# Patient Record
Sex: Male | Born: 1970
Health system: Southern US, Community
[De-identification: ages and names within clinical notes are randomized; demographics above are authoritative.]

## PROBLEM LIST (undated history)

## (undated) DIAGNOSIS — F32A Depression, unspecified: Secondary | ICD-10-CM

## (undated) DIAGNOSIS — F329 Major depressive disorder, single episode, unspecified: Secondary | ICD-10-CM

## (undated) DIAGNOSIS — I10 Essential (primary) hypertension: Secondary | ICD-10-CM

## (undated) DIAGNOSIS — E781 Pure hyperglyceridemia: Secondary | ICD-10-CM

## (undated) DIAGNOSIS — Q602 Renal agenesis, unspecified: Secondary | ICD-10-CM

## (undated) DIAGNOSIS — K219 Gastro-esophageal reflux disease without esophagitis: Secondary | ICD-10-CM

## (undated) DIAGNOSIS — E785 Hyperlipidemia, unspecified: Secondary | ICD-10-CM

## (undated) DIAGNOSIS — Q600 Renal agenesis, unilateral: Secondary | ICD-10-CM

## (undated) DIAGNOSIS — Z973 Presence of spectacles and contact lenses: Secondary | ICD-10-CM

## (undated) DIAGNOSIS — T7840XA Allergy, unspecified, initial encounter: Secondary | ICD-10-CM

## (undated) DIAGNOSIS — F419 Anxiety disorder, unspecified: Secondary | ICD-10-CM

## (undated) DIAGNOSIS — G43909 Migraine, unspecified, not intractable, without status migrainosus: Secondary | ICD-10-CM

## (undated) DIAGNOSIS — B079 Viral wart, unspecified: Secondary | ICD-10-CM

## (undated) DIAGNOSIS — Z9289 Personal history of other medical treatment: Secondary | ICD-10-CM

## (undated) HISTORY — DX: Migraine, unspecified, not intractable, without status migrainosus: G43.909

## (undated) HISTORY — DX: Depression, unspecified: F32.A

## (undated) HISTORY — DX: Hyperlipidemia, unspecified: E78.5

## (undated) HISTORY — DX: Essential (primary) hypertension: I10

## (undated) HISTORY — DX: Gastro-esophageal reflux disease without esophagitis: K21.9

## (undated) HISTORY — DX: Renal agenesis, unspecified: Q60.2

## (undated) HISTORY — DX: Major depressive disorder, single episode, unspecified: F32.9

## (undated) HISTORY — DX: Pure hyperglyceridemia: E78.1

## (undated) HISTORY — DX: Viral wart, unspecified: B07.9

## (undated) HISTORY — DX: Renal agenesis, unilateral: Q60.0

## (undated) HISTORY — DX: Anxiety disorder, unspecified: F41.9

## (undated) HISTORY — PX: WISDOM TOOTH EXTRACTION: SHX21

## (undated) HISTORY — DX: Allergy, unspecified, initial encounter: T78.40XA

## (undated) HISTORY — DX: Presence of spectacles and contact lenses: Z97.3

## (undated) HISTORY — DX: Personal history of other medical treatment: Z92.89

---

## 2002-05-24 DIAGNOSIS — I10 Essential (primary) hypertension: Secondary | ICD-10-CM

## 2002-05-24 HISTORY — DX: Essential (primary) hypertension: I10

## 2005-05-24 HISTORY — PX: KNEE SURGERY: SHX244

## 2010-05-24 HISTORY — PX: COLONOSCOPY: SHX174

## 2011-05-25 HISTORY — PX: MINOR FULGERATION OF ANAL CONDYLOMA: SHX6467

## 2012-05-24 DIAGNOSIS — B079 Viral wart, unspecified: Secondary | ICD-10-CM

## 2012-05-24 HISTORY — DX: Viral wart, unspecified: B07.9

## 2014-05-24 DIAGNOSIS — Z9289 Personal history of other medical treatment: Secondary | ICD-10-CM

## 2014-05-24 HISTORY — DX: Personal history of other medical treatment: Z92.89

## 2016-01-28 DIAGNOSIS — S93401A Sprain of unspecified ligament of right ankle, initial encounter: Secondary | ICD-10-CM | POA: Diagnosis not present

## 2016-02-05 DIAGNOSIS — S93401D Sprain of unspecified ligament of right ankle, subsequent encounter: Secondary | ICD-10-CM | POA: Diagnosis not present

## 2016-05-03 ENCOUNTER — Ambulatory Visit (INDEPENDENT_AMBULATORY_CARE_PROVIDER_SITE_OTHER): Payer: BLUE CROSS/BLUE SHIELD | Admitting: Medical

## 2016-05-03 ENCOUNTER — Telehealth: Payer: Self-pay

## 2016-05-03 ENCOUNTER — Encounter: Payer: Self-pay | Admitting: Medical

## 2016-05-03 VITALS — BP 132/70 | HR 80 | Ht 72.0 in | Wt 177.4 lb

## 2016-05-03 DIAGNOSIS — I10 Essential (primary) hypertension: Secondary | ICD-10-CM

## 2016-05-03 DIAGNOSIS — Z125 Encounter for screening for malignant neoplasm of prostate: Secondary | ICD-10-CM

## 2016-05-03 DIAGNOSIS — Z7189 Other specified counseling: Secondary | ICD-10-CM

## 2016-05-03 DIAGNOSIS — F329 Major depressive disorder, single episode, unspecified: Secondary | ICD-10-CM | POA: Diagnosis not present

## 2016-05-03 DIAGNOSIS — K219 Gastro-esophageal reflux disease without esophagitis: Secondary | ICD-10-CM

## 2016-05-03 DIAGNOSIS — Z0000 Encounter for general adult medical examination without abnormal findings: Secondary | ICD-10-CM | POA: Insufficient documentation

## 2016-05-03 DIAGNOSIS — E785 Hyperlipidemia, unspecified: Secondary | ICD-10-CM | POA: Diagnosis not present

## 2016-05-03 DIAGNOSIS — Z7185 Encounter for immunization safety counseling: Secondary | ICD-10-CM | POA: Insufficient documentation

## 2016-05-03 DIAGNOSIS — F32A Depression, unspecified: Secondary | ICD-10-CM | POA: Insufficient documentation

## 2016-05-03 HISTORY — DX: Depression, unspecified: F32.A

## 2016-05-03 LAB — CBC
HCT: 47.8 % (ref 38.5–50.0)
Hemoglobin: 15.6 g/dL (ref 13.2–17.1)
MCH: 30 pg (ref 27.0–33.0)
MCHC: 32.6 g/dL (ref 32.0–36.0)
MCV: 91.9 fL (ref 80.0–100.0)
MPV: 9.5 fL (ref 7.5–12.5)
Platelets: 351 10*3/uL (ref 140–400)
RBC: 5.2 MIL/uL (ref 4.20–5.80)
RDW: 13.1 % (ref 11.0–15.0)
WBC: 5.5 10*3/uL (ref 4.0–10.5)

## 2016-05-03 LAB — POCT URINALYSIS DIPSTICK
Bilirubin, UA: NEGATIVE
Blood, UA: NEGATIVE
Glucose, UA: NEGATIVE
Ketones, UA: NEGATIVE
Leukocytes, UA: NEGATIVE
Nitrite, UA: NEGATIVE
Protein, UA: NEGATIVE
Spec Grav, UA: 1.025
Urobilinogen, UA: NEGATIVE
pH, UA: 6

## 2016-05-03 LAB — TSH: TSH: 0.98 mIU/L (ref 0.40–4.50)

## 2016-05-03 MED ORDER — SIMVASTATIN 20 MG PO TABS: 20.0000 mg | ORAL_TABLET | Freq: Every day | ORAL | 3 refills | Status: DC

## 2016-05-03 MED ORDER — BUPROPION HCL ER (SR) 150 MG PO TB12: 150.0000 mg | ORAL_TABLET | Freq: Every day | ORAL | 1 refills | Status: DC

## 2016-05-03 MED ORDER — LISINOPRIL 5 MG PO TABS: 5.0000 mg | ORAL_TABLET | Freq: Every day | ORAL | 3 refills | Status: DC

## 2016-05-03 NOTE — Telephone Encounter (Signed)
rcvd this morning for pt from [place]. Placed on chart for physical. Chase Rogers

## 2016-05-03 NOTE — Progress Notes (Addendum)
Subjective:   HPI  Chase Rogers is a 45 y.o. male who presents for a complete physical.  New patient today.  I also see his husband for routine care.  Moved to [place] from [place].   His parents just moved here from [place], brother lives outside of [place].  Medical care team includes:  Recently established with dentist here in [place], [place], [place] establishing today for primary care  Doesn't have eye doctor yet   Prior vaccinations: TD or Tdap:  2015  Concerns: none  Reviewed their medical, surgical, family, social, medication, and allergy history and updated chart as appropriate.  Past Medical History:  Diagnosis Date  . Allergy   . Congenital single kidney    per prior ultrasound and other imaging  . Depression   . GERD (gastroesophageal reflux disease)   . History of EKG 2016   normal per report  . Hyperlipidemia   . Hypertension 2004  . Migraine   . Wart 2014   finger  . Wears contact lenses     Past Surgical History:  Procedure Laterality Date  . COLONOSCOPY  2012   abdominal pain, brother hx/o Crohns.  normal per patient  . KNEE SURGERY  2007   right meniscal tear  . MINOR FULGERATION OF ANAL CONDYLOMA  2013    Social History   Social History  . Marital status: Married    Spouse name: N/A  . Number of children: N/A  . Years of education: N/A   Occupational History  . Not on file.   Social History Main Topics  . Smoking status: Former Research scientist (life sciences)  . Smokeless tobacco: Never Used  . Alcohol use 3.6 oz/week    2 Glasses of wine, 2 Cans of beer, 2 Shots of liquor per week  . Drug use: No  . Sexual activity: Yes   Other Topics Concern  . Not on file   Social History Narrative   Lives with husband.  Exercise - "needs to do more".   Eats relatively healthy.   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 Arthritis Mother   . Hyperlipidemia Mother   . Hypertension Mother    . Cancer Mother     melanoma  . Arthritis Father     TKR  . Crohn's disease Brother   . Cancer Maternal Grandmother     breast, stomach  . Heart disease Maternal Grandfather     MI  . Cancer Paternal Grandmother     colon     Current Outpatient Prescriptions:  .  buPROPion (WELLBUTRIN SR) 150 MG 12 hr tablet, Take 1 tablet (150 mg total) by mouth daily., Disp: 90 tablet, Rfl: 1 .  esomeprazole (NEXIUM) 20 MG capsule, Take 20 mg by mouth daily at 12 noon., Disp: , Rfl:  .  lisinopril (PRINIVIL,ZESTRIL) 5 MG tablet, Take 1 tablet (5 mg total) by mouth daily., Disp: 90 tablet, Rfl: 3 .  Multiple Vitamin (MULTIVITAMIN) tablet, Take 1 tablet by mouth daily., Disp: , Rfl:  .  Omega-3 Fatty Acids (FISH OIL) 1000 MG CPDR, Take by mouth daily., Disp: , Rfl:  .  simvastatin (ZOCOR) 20 MG tablet, Take 1 tablet (20 mg total) by mouth daily., Disp: 90 tablet, Rfl: 3  Allergies  Allergen Reactions  . Erythromycin     Age 20, unsure of allergy      Review of Systems Constitutional: -fever, -chills, -sweats, -unexpected  weight change, -decreased appetite, -fatigue Allergy: -sneezing, -itching, -congestion Dermatology: -changing moles, --rash, -lumps ENT: -runny nose, -ear pain, -sore throat, -hoarseness, -sinus pain, -teeth pain, - ringing in ears, -hearing loss, -nosebleeds Cardiology: -chest pain, -palpitations, -swelling, -difficulty breathing when lying flat, -waking up short of breath Respiratory: -cough, -shortness of breath, -difficulty breathing with exercise or exertion, -wheezing, -coughing up blood Gastroenterology: -abdominal pain, -nausea, -vomiting, -diarrhea, -constipation, -blood in stool, -changes in bowel movement, -difficulty swallowing or eating Hematology: -bleeding, -bruising  Musculoskeletal: -joint aches, -muscle aches, -joint swelling, -back pain, -neck pain, -cramping, -changes in gait Ophthalmology: denies vision changes, eye redness, itching, discharge Urology:  -burning with urination, -difficulty urinating, -blood in urine, -urinary frequency, -urgency, -incontinence Neurology: -headache, -weakness, -tingling, -numbness, -memory loss, -falls, -dizziness Psychology: -depressed mood, -agitation, -sleep problems     Objective:   Physical Exam  BP 132/70   Pulse 80   Ht 6' (1.829 m)   Wt 177 lb 6.4 oz (80.5 kg)   SpO2 98%   BMI 24.06 kg/m   General appearance: alert, no distress, WD/WN, white male Skin: scattered macules, no worrisome lesions, tattoo of red scorpion right inguinal area, tattoo red devil and pitch fork, right upper back HEENT: normocephalic, conjunctiva/corneas normal, sclerae anicteric, PERRLA, EOMi, nares patent, no discharge or erythema, pharynx normal Oral cavity: MMM, tongue normal, teeth normal Neck: supple, no lymphadenopathy, no thyromegaly, no masses, normal ROM, no bruits Chest: non tender, normal shape and expansion Heart: RRR, normal S1, S2, no murmurs Lungs: CTA bilaterally, no wheezes, rhonchi, or rales Abdomen: +bs, soft, non tender, non distended, no masses, no hepatomegaly, no splenomegaly, no bruits Back: non tender, normal ROM, no scoliosis Musculoskeletal: faint port scars right anterior knee otherwise upper extremities non tender, no obvious deformity, normal ROM throughout, lower extremities non tender, no obvious deformity, normal ROM throughout Extremities: no edema, no cyanosis, no clubbing Pulses: 2+ symmetric, upper and lower extremities, normal cap refill Neurological: alert, oriented x 3, CN2-12 intact, strength normal upper extremities and lower extremities, sensation normal throughout, DTRs 2+ throughout, no cerebellar signs, gait normal Psychiatric: normal affect, behavior normal, pleasant  GU: normal male external genitalia, circumcised, nontender, no masses, no hernia, no lymphadenopathy Rectal: anus normal tone, prostate WNL    Assessment and Plan :    Encounter Diagnoses  Name Primary?   . Encounter for health maintenance examination in adult Yes  . Essential hypertension   . Hyperlipidemia, unspecified hyperlipidemia type   . Vaccine counseling   . Gastroesophageal reflux disease without esophagitis   . Depression, unspecified depression type   . Screening for prostate cancer     Physical exam - discussed healthy lifestyle, diet, exercise, preventative care, vaccinations, and addressed their concerns.   See your eye doctor yearly for routine vision care. See your dentist yearly for routine dental care including hygiene visits twice yearly. Flu and Td up to date Routine labs today C/t same medications Follow-up pending labs, yearly for CPX  Morey was seen today for new pat.and physical.  Diagnoses and all orders for this visit:  Encounter for health maintenance examination in adult -     CBC -     Comprehensive metabolic panel -     Lipid panel -     TSH -     PSA  Essential hypertension  Hyperlipidemia, unspecified hyperlipidemia type  Vaccine counseling  Gastroesophageal reflux disease without esophagitis  Depression, unspecified depression type  Screening for prostate cancer -     PSA  Other orders -     buPROPion (WELLBUTRIN SR) 150 MG 12 hr tablet; Take 1 tablet (150 mg total) by mouth daily. -     lisinopril (PRINIVIL,ZESTRIL) 5 MG tablet; Take 1 tablet (5 mg total) by mouth daily. -     simvastatin (ZOCOR) 20 MG tablet; Take 1 tablet (20 mg total) by mouth daily.

## 2016-05-03 NOTE — Telephone Encounter (Signed)
This is yours

## 2016-05-03 NOTE — Addendum Note (Signed)
Addended by: Tyrone Apple on: 05/03/2016 09:28 AM   Modules accepted: Orders

## 2016-05-03 NOTE — Patient Instructions (Signed)
  Thank you for giving me the opportunity to serve you today.    Your diagnosis today includes: Encounter Diagnoses  Name Primary?  . Encounter for health maintenance examination in adult Yes  . Essential hypertension   . Hyperlipidemia, unspecified hyperlipidemia type   . Vaccine counseling   . Gastroesophageal reflux disease without esophagitis   . Depression, unspecified depression type     EXERCISE!  Most days of the week for 45 minutes or more Eat a healthy low fat diet Continue your current medications   [facility] Dr. Camillo Flaming [address], [address] [place], [place] [address]  [email or web address]   Fabio Pierce, M.D. Corena Herter, O.D. [place], [place], [place] [address] Medical telephone: [phone] Optical telephone: [phone]

## 2016-05-04 LAB — LIPID PANEL
Cholesterol: 163 mg/dL (ref <200)
HDL: 51 mg/dL (ref >40)
LDL Cholesterol: 72 mg/dL (ref <100)
Total CHOL/HDL Ratio: 3.2 Ratio (ref <5.0)
Triglycerides: 201 mg/dL — ABNORMAL HIGH (ref <150)
VLDL: 40 mg/dL — ABNORMAL HIGH (ref <30)

## 2016-05-04 LAB — COMPREHENSIVE METABOLIC PANEL
ALT: 40 U/L (ref 9–46)
AST: 33 U/L (ref 10–40)
Albumin: 4.8 g/dL (ref 3.6–5.1)
Alkaline Phosphatase: 72 U/L (ref 40–115)
BUN: 13 mg/dL (ref 7–25)
CO2: 26 mmol/L (ref 20–31)
Calcium: 9.9 mg/dL (ref 8.6–10.3)
Chloride: 102 mmol/L (ref 98–110)
Creat: 0.99 mg/dL (ref 0.60–1.35)
Glucose, Bld: 102 mg/dL — ABNORMAL HIGH (ref 65–99)
Potassium: 4.7 mmol/L (ref 3.5–5.3)
Sodium: 137 mmol/L (ref 135–146)
Total Bilirubin: 0.4 mg/dL (ref 0.2–1.2)
Total Protein: 7.3 g/dL (ref 6.1–8.1)

## 2016-05-04 LAB — PSA: PSA: 0.4 ng/mL (ref <=4.0)

## 2016-05-25 DIAGNOSIS — I10 Essential (primary) hypertension: Secondary | ICD-10-CM | POA: Diagnosis not present

## 2016-05-25 DIAGNOSIS — J22 Unspecified acute lower respiratory infection: Secondary | ICD-10-CM | POA: Diagnosis not present

## 2016-05-25 DIAGNOSIS — J019 Acute sinusitis, unspecified: Secondary | ICD-10-CM | POA: Diagnosis not present

## 2016-05-25 DIAGNOSIS — B9689 Other specified bacterial agents as the cause of diseases classified elsewhere: Secondary | ICD-10-CM | POA: Diagnosis not present

## 2016-06-18 ENCOUNTER — Encounter: Payer: Self-pay | Admitting: Medical

## 2016-07-18 DIAGNOSIS — J020 Streptococcal pharyngitis: Secondary | ICD-10-CM | POA: Diagnosis not present

## 2017-03-01 ENCOUNTER — Encounter: Payer: Self-pay | Admitting: Medical

## 2017-03-13 ENCOUNTER — Other Ambulatory Visit: Payer: Self-pay | Admitting: Medical

## 2017-03-14 NOTE — Telephone Encounter (Signed)
Can pt have a refill on this 

## 2017-03-15 NOTE — Telephone Encounter (Signed)
Refill, get in for physical in December

## 2017-03-22 ENCOUNTER — Telehealth: Payer: Self-pay | Admitting: Medical

## 2017-03-22 NOTE — Telephone Encounter (Signed)
  Fax from [facility]  Bupropion 150mg   #90  Last filled 12/11/16

## 2017-03-23 NOTE — Telephone Encounter (Signed)
I just filled this 03/15/17

## 2017-04-02 ENCOUNTER — Other Ambulatory Visit: Payer: Self-pay | Admitting: Medical

## 2017-04-04 NOTE — Telephone Encounter (Signed)
Why do we keep getting this refill request.  It was done 03/15/17.

## 2017-04-25 ENCOUNTER — Other Ambulatory Visit: Payer: Self-pay | Admitting: Medical

## 2017-05-11 ENCOUNTER — Encounter: Payer: Self-pay | Admitting: Medical

## 2017-05-11 ENCOUNTER — Other Ambulatory Visit: Payer: Self-pay | Admitting: Medical

## 2017-05-11 ENCOUNTER — Ambulatory Visit: Payer: BLUE CROSS/BLUE SHIELD | Admitting: Medical

## 2017-05-11 VITALS — BP 122/70 | HR 98 | Ht 71.0 in | Wt 175.4 lb

## 2017-05-11 DIAGNOSIS — F325 Major depressive disorder, single episode, in full remission: Secondary | ICD-10-CM

## 2017-05-11 DIAGNOSIS — Z113 Encounter for screening for infections with a predominantly sexual mode of transmission: Secondary | ICD-10-CM | POA: Diagnosis not present

## 2017-05-11 DIAGNOSIS — K219 Gastro-esophageal reflux disease without esophagitis: Secondary | ICD-10-CM | POA: Diagnosis not present

## 2017-05-11 DIAGNOSIS — I10 Essential (primary) hypertension: Secondary | ICD-10-CM

## 2017-05-11 DIAGNOSIS — Z0000 Encounter for general adult medical examination without abnormal findings: Secondary | ICD-10-CM

## 2017-05-11 DIAGNOSIS — Z209 Contact with and (suspected) exposure to unspecified communicable disease: Secondary | ICD-10-CM

## 2017-05-11 DIAGNOSIS — E785 Hyperlipidemia, unspecified: Secondary | ICD-10-CM | POA: Diagnosis not present

## 2017-05-11 DIAGNOSIS — L84 Corns and callosities: Secondary | ICD-10-CM | POA: Insufficient documentation

## 2017-05-11 DIAGNOSIS — Z7185 Encounter for immunization safety counseling: Secondary | ICD-10-CM

## 2017-05-11 DIAGNOSIS — Z7189 Other specified counseling: Secondary | ICD-10-CM

## 2017-05-11 LAB — POCT URINALYSIS DIP (PROADVANTAGE DEVICE)
Bilirubin, UA: NEGATIVE
Blood, UA: NEGATIVE
Glucose, UA: NEGATIVE mg/dL
Ketones, POC UA: NEGATIVE mg/dL
Leukocytes, UA: NEGATIVE
Nitrite, UA: NEGATIVE
Protein Ur, POC: NEGATIVE mg/dL
Specific Gravity, Urine: 1.02
Urobilinogen, Ur: NEGATIVE
pH, UA: 7 (ref 5.0–8.0)

## 2017-05-11 NOTE — Progress Notes (Signed)
Subjective:   HPI  Chase Rogers is a 46 y.o. male who presents for a complete physical.  I also see his husband for routine care.  Moved to [place] from [place].   His parents just moved here from [place], brother lives outside of [place].   Medical care team includes:  dentist  Bradie Sangiovanni, Camelia Eng, PA-C establishing today for primary care  Eye doctor  Concerns: History of migraines but none of any concern since age 8.  Gets occasional migraine.  Uses Relpax.  He has some corns of the right foot he wants to discuss.  Not using anything for this   he is compliant with his blood pressure medicine.  Uses Nexium over-the-counter as needed  He regularly travels once monthly to [place], [place].   but recently there have been several hepatitis A  breakouts.  Wants testing possibly vaccine as well.   Reviewed their medical, surgical, family, social, medication, and allergy history and updated chart as appropriate.  Past Medical History:  Diagnosis Date  . Allergy   . Congenital single kidney    per prior ultrasound and other imaging  . Depression   . GERD (gastroesophageal reflux disease)   . History of EKG 2016   normal per report  . Hyperlipidemia   . Hypertension 2004  . Migraine   . Wart 2014   finger  . Wears contact lenses     Past Surgical History:  Procedure Laterality Date  . COLONOSCOPY  2012   abdominal pain, brother hx/o Crohns.  normal per patient  . KNEE SURGERY  2007   right meniscal tear  . MINOR FULGERATION OF ANAL CONDYLOMA  2013    Social History   Socioeconomic History  . Marital status: Married    Spouse name: Not on file  . Number of children: Not on file  . Years of education: Not on file  . Highest education level: Not on file  Social Needs  . Financial resource strain: Not on file  . Food insecurity - worry: Not on file  . Food insecurity - inability: Not on file  . Transportation needs - medical: Not on file  . Transportation needs  - non-medical: Not on file  Occupational History  . Not on file  Tobacco Use  . Smoking status: Former Research scientist (life sciences)  . Smokeless tobacco: Never Used  Substance and Sexual Activity  . Alcohol use: Yes    Alcohol/week: 3.6 oz    Types: 2 Glasses of wine, 2 Cans of beer, 2 Shots of liquor per week  . Drug use: No  . Sexual activity: Yes  Other Topics Concern  . Not on file  Social History Narrative   Lives with husband.  Exercise -[employer] regularly.   Eats relatively healthy.   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 Arthritis Mother   . Hyperlipidemia Mother   . Hypertension Mother   . Cancer Mother        melanoma  . Arthritis Father        TKR  . Crohn's disease Brother   . Cancer Maternal Grandmother        breast, stomach  . Heart disease Maternal Grandfather        MI  . Cancer Paternal Grandmother        colon  . Heart disease Paternal Uncle        multiple MIs  Current Outpatient Medications:  .  buPROPion (WELLBUTRIN SR) 150 MG 12 hr tablet, TAKE 1 TABLET (150 MG TOTAL) BY MOUTH DAILY., Disp: 90 tablet, Rfl: 0 .  esomeprazole (NEXIUM) 20 MG capsule, Take 20 mg by mouth daily at 12 noon., Disp: , Rfl:  .  lisinopril (PRINIVIL,ZESTRIL) 5 MG tablet, TAKE 1 TABLET (5 MG TOTAL) BY MOUTH DAILY., Disp: 90 tablet, Rfl: 0 .  Multiple Vitamin (MULTIVITAMIN) tablet, Take 1 tablet by mouth daily., Disp: , Rfl:  .  Omega-3 Fatty Acids (FISH OIL) 1000 MG CPDR, Take by mouth daily., Disp: , Rfl:  .  simvastatin (ZOCOR) 20 MG tablet, TAKE 1 TABLET (20 MG TOTAL) BY MOUTH DAILY., Disp: 90 tablet, Rfl: 0  Allergies  Allergen Reactions  . Erythromycin     Age 39, unsure of allergy      Review of Systems Constitutional: -fever, -chills, -sweats, -unexpected weight change, -decreased appetite, -fatigue Allergy: -sneezing, -itching, -congestion Dermatology: -changing moles, --rash, -lumps ENT: -runny nose,  -ear pain, -sore throat, -hoarseness, -sinus pain, -teeth pain, - ringing in ears, -hearing loss, -nosebleeds Cardiology: -chest pain, -palpitations, -swelling, -difficulty breathing when lying flat, -waking up short of breath Respiratory: -cough, -shortness of breath, -difficulty breathing with exercise or exertion, -wheezing, -coughing up blood Gastroenterology: -abdominal pain, -nausea, -vomiting, -diarrhea, -constipation, -blood in stool, -changes in bowel movement, -difficulty swallowing or eating Hematology: -bleeding, -bruising  Musculoskeletal: -joint aches, -muscle aches, -joint swelling, -back pain, -neck pain, -cramping, -changes in gait Ophthalmology: denies vision changes, eye redness, itching, discharge Urology: -burning with urination, -difficulty urinating, -blood in urine, -urinary frequency, -urgency, -incontinence Neurology: -headache, -weakness, -tingling, -numbness, -memory loss, -falls, -dizziness Psychology: -depressed mood, -agitation, -sleep problems     Objective:   Physical Exam  BP 122/70   Pulse 98   Ht 5\' 11"  (1.803 m)   Wt 175 lb 6.4 oz (79.6 kg)   SpO2 98%   BMI 24.46 kg/m   General appearance: alert, no distress, WD/WN, white male Skin: scattered macules, no worrisome lesions, tattoo red devil and pitch fork, left upper back, right foot volar surface of great toe and mid foot somewhat laterally with corns that are tender HEENT: normocephalic, conjunctiva/corneas normal, sclerae anicteric, PERRLA, EOMi, nares patent, no discharge or erythema, pharynx normal Oral cavity: MMM, tongue normal, teeth normal Neck: supple, no lymphadenopathy, no thyromegaly, no masses, normal ROM, no bruits Chest: non tender, normal shape and expansion Heart: RRR, normal S1, S2, no murmurs Lungs: CTA bilaterally, no wheezes, rhonchi, or rales Abdomen: +bs, soft, non tender, non distended, no masses, no hepatomegaly, no splenomegaly, no bruits Back: non tender, normal ROM, no  scoliosis Musculoskeletal: faint port scars right anterior knee otherwise upper extremities non tender, no obvious deformity, normal ROM throughout, lower extremities non tender, no obvious deformity, normal ROM throughout Extremities: no edema, no cyanosis, no clubbing Pulses: 2+ symmetric, upper and lower extremities, normal cap refill Neurological: alert, oriented x 3, CN2-12 intact, strength normal upper extremities and lower extremities, sensation normal throughout, DTRs 2+ throughout, no cerebellar signs, gait normal Psychiatric: normal affect, behavior normal, pleasant  GU: normal male external genitalia, circumcised, nontender, no masses, no hernia, no lymphadenopathy Rectal: deferred     Assessment and Plan :    Encounter Diagnoses  Name Primary?  . Routine general medical examination at a health care facility Yes  . Essential hypertension   . Hyperlipidemia, unspecified hyperlipidemia type   . Gastroesophageal reflux disease without esophagitis   . Vaccine counseling   .  Screen for STD (sexually transmitted disease)   . Exposure to communicable disease   . Corn of foot   . Major depression in remission [facility])     Physical exam - discussed healthy lifestyle, diet, exercise, preventative care, vaccinations, and addressed their concerns.   See your eye doctor yearly for routine vision care. See your dentist yearly for routine dental care including hygiene visits twice yearly. Routine labs today C/t same medications Discussed hepatitis screening, and will likely recommend Hep A/B vaccination C/t monthly self testicular exam Corns of right foot - discussed home care with compound W.  If not much improved in the next month, we can refer to dermatology Follow-up pending labs, yearly for CPX  Gurvir was seen today for annual exam.  Diagnoses and all orders for this visit:  Routine general medical examination at a health care facility -     POCT Urinalysis DIP ([employer]  Device) -     Lipid panel -     Comprehensive metabolic panel -     CBC -     Hemoglobin A1c -     HIV antibody -     RPR -     C. trachomatis/N. gonorrhoeae RNA -     Hepatitis panel, acute -     Hepatitis B surface antibody  Essential hypertension  Hyperlipidemia, unspecified hyperlipidemia type  Gastroesophageal reflux disease without esophagitis  Vaccine counseling  Screen for STD (sexually transmitted disease) -     HIV antibody -     RPR -     C. trachomatis/N. gonorrhoeae RNA -     Hepatitis panel, acute -     Hepatitis B surface antibody  Exposure to communicable disease -     HIV antibody -     RPR -     C. trachomatis/N. gonorrhoeae RNA -     Hepatitis panel, acute -     Hepatitis B surface antibody  Corn of foot  Major depression in remission ([place])

## 2017-05-12 LAB — C. TRACHOMATIS/N. GONORRHOEAE RNA
C. trachomatis RNA, TMA: NOT DETECTED
N. gonorrhoeae RNA, TMA: NOT DETECTED

## 2017-05-13 LAB — COMPREHENSIVE METABOLIC PANEL
AG Ratio: 1.7 (calc) (ref 1.0–2.5)
ALT: 37 U/L (ref 9–46)
AST: 28 U/L (ref 10–40)
Albumin: 4.5 g/dL (ref 3.6–5.1)
Alkaline phosphatase (APISO): 82 U/L (ref 40–115)
BUN: 13 mg/dL (ref 7–25)
CO2: 25 mmol/L (ref 20–32)
Calcium: 10 mg/dL (ref 8.6–10.3)
Chloride: 103 mmol/L (ref 98–110)
Creat: 0.95 mg/dL (ref 0.60–1.35)
Globulin: 2.6 g/dL (calc) (ref 1.9–3.7)
Glucose, Bld: 102 mg/dL — ABNORMAL HIGH (ref 65–99)
Potassium: 4.6 mmol/L (ref 3.5–5.3)
Sodium: 138 mmol/L (ref 135–146)
Total Bilirubin: 0.7 mg/dL (ref 0.2–1.2)
Total Protein: 7.1 g/dL (ref 6.1–8.1)

## 2017-05-13 LAB — CBC
HCT: 46.4 % (ref 38.5–50.0)
Hemoglobin: 15.9 g/dL (ref 13.2–17.1)
MCH: 29.3 pg (ref 27.0–33.0)
MCHC: 34.3 g/dL (ref 32.0–36.0)
MCV: 85.6 fL (ref 80.0–100.0)
MPV: 9.7 fL (ref 7.5–12.5)
Platelets: 415 10*3/uL — ABNORMAL HIGH (ref 140–400)
RBC: 5.42 10*6/uL (ref 4.20–5.80)
RDW: 12.7 % (ref 11.0–15.0)
WBC: 5.1 10*3/uL (ref 3.8–10.8)

## 2017-05-13 LAB — HEPATITIS PANEL, ACUTE
Hep A IgM: NONREACTIVE
Hep B C IgM: NONREACTIVE
Hepatitis B Surface Ag: NONREACTIVE
Hepatitis C Ab: NONREACTIVE
SIGNAL TO CUT-OFF: 0.04 (ref <1.00)

## 2017-05-13 LAB — HEMOGLOBIN A1C
Hgb A1c MFr Bld: 5.4 % of total Hgb (ref <5.7)
Mean Plasma Glucose: 108 (calc)
eAG (mmol/L): 6 (calc)

## 2017-05-13 LAB — LIPID PANEL
Cholesterol: 180 mg/dL (ref <200)
HDL: 50 mg/dL (ref >40)
LDL Cholesterol (Calc): 102 mg/dL (calc) — ABNORMAL HIGH
Non-HDL Cholesterol (Calc): 130 mg/dL (calc) — ABNORMAL HIGH (ref <130)
Total CHOL/HDL Ratio: 3.6 (calc) (ref <5.0)
Triglycerides: 167 mg/dL — ABNORMAL HIGH (ref <150)

## 2017-05-13 LAB — HIV ANTIBODY (ROUTINE TESTING W REFLEX): HIV 1&2 Ab, 4th Generation: NONREACTIVE

## 2017-05-13 LAB — FLUORESCENT TREPONEMAL AB(FTA)-IGG-BLD: Fluorescent Treponemal ABS: REACTIVE — AB

## 2017-05-13 LAB — RPR TITER: RPR Titer: 1:16 {titer} — ABNORMAL HIGH

## 2017-05-13 LAB — RPR: RPR Ser Ql: REACTIVE — AB

## 2017-05-14 LAB — HEPATITIS B SURFACE ANTIBODY,QUALITATIVE: Hep B S Ab: NONREACTIVE

## 2017-05-18 ENCOUNTER — Telehealth: Payer: Self-pay | Admitting: Medical

## 2017-05-18 NOTE — Telephone Encounter (Signed)
Pt returned you call. Number in system was verified. Please call pt at [phone].

## 2017-05-25 ENCOUNTER — Other Ambulatory Visit (INDEPENDENT_AMBULATORY_CARE_PROVIDER_SITE_OTHER): Payer: BLUE CROSS/BLUE SHIELD

## 2017-05-25 DIAGNOSIS — Z23 Encounter for immunization: Secondary | ICD-10-CM | POA: Diagnosis not present

## 2017-05-25 DIAGNOSIS — Z209 Contact with and (suspected) exposure to unspecified communicable disease: Secondary | ICD-10-CM

## 2017-05-25 MED ORDER — PENICILLIN G BENZATHINE 2400000 UNIT/4ML IM SUSP
2.4000 10*6.[IU] | Freq: Once | INTRAMUSCULAR | Status: AC
  Administered 2017-05-25 – 2017-05-26 (×2): 2.4 10*6.[IU] via INTRAMUSCULAR

## 2017-05-27 ENCOUNTER — Encounter: Payer: Self-pay | Admitting: Medical

## 2017-05-30 ENCOUNTER — Telehealth: Payer: Self-pay | Admitting: Medical

## 2017-05-30 NOTE — Telephone Encounter (Signed)
Called pt hev said  was fine , does  he need an appt?

## 2017-05-30 NOTE — Telephone Encounter (Signed)
Call regarding f/u on RPR.

## 2017-06-02 ENCOUNTER — Telehealth: Payer: Self-pay | Admitting: Family Medicine

## 2017-06-02 ENCOUNTER — Telehealth: Payer: Self-pay

## 2017-06-02 NOTE — Telephone Encounter (Signed)
Please call Nicholas Lose at the [facility] regarding this patient's syphilis and his treatment.

## 2017-06-02 NOTE — Telephone Encounter (Signed)
[facility] health department called regarding Syphilis results, spoke with him, no other questions. Just wanted to verify address, and number.

## 2017-06-03 ENCOUNTER — Other Ambulatory Visit: Payer: BLUE CROSS/BLUE SHIELD

## 2017-06-03 ENCOUNTER — Other Ambulatory Visit: Payer: Self-pay

## 2017-06-03 DIAGNOSIS — A539 Syphilis, unspecified: Secondary | ICD-10-CM

## 2017-06-03 MED ORDER — PENICILLIN G BENZATHINE 2400000 UNIT/4ML IM SUSP
2.4000 10*6.[IU] | Freq: Once | INTRAMUSCULAR | Status: AC
  Administered 2017-06-03: 2.4 10*6.[IU] via INTRAMUSCULAR

## 2017-06-03 NOTE — Telephone Encounter (Signed)
pls call patient.   First, verify he did come in 05/25/17 for his first Bi-Cillin injection.   I can't see the injection in the chart (check with Beverlee Nims).  The plan at this point is to have 2 more injections 1 week apart.    Have him come in today if possible for 2.4 million units of Bi-Cillin LA , intramuscular injection, and repeat in a week.      I will need to have him repeat labs in 6, 12, and 24 months.  Lets do recheck OV in 4mo

## 2017-06-03 NOTE — Telephone Encounter (Signed)
Called pt he is coming in today at 4:30pm for injection

## 2017-06-03 NOTE — Telephone Encounter (Signed)
Chase Rogers called & confirmed appropriative Syphilis treatment is injection once a week for 3 weeks then titer from 90 days to 6 months later.

## 2017-06-27 ENCOUNTER — Other Ambulatory Visit (INDEPENDENT_AMBULATORY_CARE_PROVIDER_SITE_OTHER): Payer: BLUE CROSS/BLUE SHIELD

## 2017-06-27 DIAGNOSIS — Z23 Encounter for immunization: Secondary | ICD-10-CM

## 2017-08-03 ENCOUNTER — Other Ambulatory Visit: Payer: Self-pay | Admitting: Medical

## 2017-08-03 DIAGNOSIS — H01004 Unspecified blepharitis left upper eyelid: Secondary | ICD-10-CM | POA: Diagnosis not present

## 2017-08-05 ENCOUNTER — Ambulatory Visit: Payer: Self-pay | Admitting: Medical

## 2017-08-23 ENCOUNTER — Encounter: Payer: Self-pay | Admitting: Medical

## 2017-10-29 ENCOUNTER — Other Ambulatory Visit: Payer: Self-pay | Admitting: Medical

## 2017-12-24 ENCOUNTER — Other Ambulatory Visit: Payer: Self-pay | Admitting: Medical

## 2018-01-05 ENCOUNTER — Other Ambulatory Visit (INDEPENDENT_AMBULATORY_CARE_PROVIDER_SITE_OTHER): Payer: BLUE CROSS/BLUE SHIELD

## 2018-01-05 DIAGNOSIS — Z23 Encounter for immunization: Secondary | ICD-10-CM

## 2018-01-25 ENCOUNTER — Other Ambulatory Visit: Payer: Self-pay | Admitting: Medical

## 2018-01-25 DIAGNOSIS — S39012A Strain of muscle, fascia and tendon of lower back, initial encounter: Secondary | ICD-10-CM | POA: Diagnosis not present

## 2018-01-25 DIAGNOSIS — M62830 Muscle spasm of back: Secondary | ICD-10-CM | POA: Diagnosis not present

## 2018-01-25 DIAGNOSIS — M545 Low back pain: Secondary | ICD-10-CM | POA: Diagnosis not present

## 2018-02-03 ENCOUNTER — Other Ambulatory Visit: Payer: Self-pay

## 2018-02-06 MED ORDER — LISINOPRIL 5 MG PO TABS: 5.0000 mg | ORAL_TABLET | Freq: Every day | ORAL | 0 refills | Status: DC

## 2018-02-06 MED ORDER — BUPROPION HCL ER (SR) 150 MG PO TB12: 150.0000 mg | ORAL_TABLET | Freq: Every day | ORAL | 0 refills | Status: DC

## 2018-02-06 MED ORDER — SIMVASTATIN 20 MG PO TABS: 20.0000 mg | ORAL_TABLET | Freq: Every day | ORAL | 0 refills | Status: DC

## 2018-03-18 DIAGNOSIS — R52 Pain, unspecified: Secondary | ICD-10-CM | POA: Diagnosis not present

## 2018-03-18 DIAGNOSIS — J029 Acute pharyngitis, unspecified: Secondary | ICD-10-CM | POA: Diagnosis not present

## 2018-03-18 DIAGNOSIS — J069 Acute upper respiratory infection, unspecified: Secondary | ICD-10-CM | POA: Diagnosis not present

## 2018-04-29 DIAGNOSIS — J069 Acute upper respiratory infection, unspecified: Secondary | ICD-10-CM | POA: Diagnosis not present

## 2018-04-29 DIAGNOSIS — J029 Acute pharyngitis, unspecified: Secondary | ICD-10-CM | POA: Diagnosis not present

## 2018-04-29 DIAGNOSIS — H04209 Unspecified epiphora, unspecified lacrimal gland: Secondary | ICD-10-CM | POA: Diagnosis not present

## 2018-04-29 DIAGNOSIS — R067 Sneezing: Secondary | ICD-10-CM | POA: Diagnosis not present

## 2018-04-30 ENCOUNTER — Other Ambulatory Visit: Payer: Self-pay | Admitting: Medical

## 2018-05-12 ENCOUNTER — Ambulatory Visit: Payer: BLUE CROSS/BLUE SHIELD | Admitting: Medical

## 2018-05-12 ENCOUNTER — Encounter: Payer: Self-pay | Admitting: Medical

## 2018-05-12 VITALS — BP 122/70 | HR 87 | Temp 98.1°F | Resp 16 | Ht 71.0 in | Wt 179.2 lb

## 2018-05-12 DIAGNOSIS — Z23 Encounter for immunization: Secondary | ICD-10-CM

## 2018-05-12 DIAGNOSIS — Z808 Family history of malignant neoplasm of other organs or systems: Secondary | ICD-10-CM

## 2018-05-12 DIAGNOSIS — Z8619 Personal history of other infectious and parasitic diseases: Secondary | ICD-10-CM | POA: Diagnosis not present

## 2018-05-12 DIAGNOSIS — F329 Major depressive disorder, single episode, unspecified: Secondary | ICD-10-CM

## 2018-05-12 DIAGNOSIS — Z113 Encounter for screening for infections with a predominantly sexual mode of transmission: Secondary | ICD-10-CM

## 2018-05-12 DIAGNOSIS — E785 Hyperlipidemia, unspecified: Secondary | ICD-10-CM | POA: Diagnosis not present

## 2018-05-12 DIAGNOSIS — Z0000 Encounter for general adult medical examination without abnormal findings: Secondary | ICD-10-CM

## 2018-05-12 DIAGNOSIS — I10 Essential (primary) hypertension: Secondary | ICD-10-CM | POA: Diagnosis not present

## 2018-05-12 DIAGNOSIS — Z905 Acquired absence of kidney: Secondary | ICD-10-CM | POA: Insufficient documentation

## 2018-05-12 DIAGNOSIS — K219 Gastro-esophageal reflux disease without esophagitis: Secondary | ICD-10-CM | POA: Diagnosis not present

## 2018-05-12 DIAGNOSIS — F32A Depression, unspecified: Secondary | ICD-10-CM

## 2018-05-12 DIAGNOSIS — IMO0002 Reserved for concepts with insufficient information to code with codable children: Secondary | ICD-10-CM

## 2018-05-12 DIAGNOSIS — Q600 Renal agenesis, unilateral: Secondary | ICD-10-CM | POA: Diagnosis not present

## 2018-05-12 LAB — POCT URINALYSIS DIP (PROADVANTAGE DEVICE)
Bilirubin, UA: NEGATIVE
Blood, UA: NEGATIVE
Glucose, UA: NEGATIVE mg/dL
Ketones, POC UA: NEGATIVE mg/dL
Leukocytes, UA: NEGATIVE
Nitrite, UA: NEGATIVE
Protein Ur, POC: NEGATIVE mg/dL
Specific Gravity, Urine: 1.02
Urobilinogen, Ur: NEGATIVE
pH, UA: 6 (ref 5.0–8.0)

## 2018-05-12 MED ORDER — LISINOPRIL 5 MG PO TABS: 5.0000 mg | ORAL_TABLET | Freq: Every day | ORAL | 3 refills | Status: DC

## 2018-05-12 MED ORDER — BUPROPION HCL ER (SR) 150 MG PO TB12: 150.0000 mg | ORAL_TABLET | Freq: Two times a day (BID) | ORAL | 0 refills | Status: DC

## 2018-05-12 NOTE — Patient Instructions (Signed)
Recommendations  I recommend you get regular exercise such as 150 minutes/week of cardiac exercise or more, I recommend you quit smoking  Call 1 800 quit now hotline for good advice and tips on ways to start cutting back  We updated your Tdap, tetanus diphtheria pertussis vaccine today.  This is good for 10 years  We will call back with lab results.  Last year you are positive for syphilis and we gave you treatment for this.  We are rechecking the titers   Yearly screenings See your eye doctor yearly for routine vision care. See your dentist yearly for routine dental care including hygiene visits twice yearly. See me here yearly for a routine physical and preventative care visit     Preventative Care for Adults - Male      [place]:  A routine yearly physical is a good way to check in with your primary care provider about your health and preventive screening. It is also an opportunity to share updates about your health and any concerns you have, and receive a thorough all-over exam.   Most health insurance companies pay for at least some preventative services.  Check with your health plan for specific coverages.  WHAT PREVENTATIVE SERVICES DO MEN NEED?  Adult men should have their weight and blood pressure checked regularly.   Men age 47 and older should have their cholesterol levels checked regularly.  Beginning at age 47 and continuing to age 60, men should be screened for colorectal cancer.  Certain people may need continued testing until age 65.  Updating vaccinations is part of preventative care.  Vaccinations help protect against diseases such as the flu.  Osteoporosis is a disease in which the bones lose minerals and strength as we age. Men ages 47 and over should discuss this with their caregivers  Lab tests are generally done as part of preventative care to screen for anemia and blood disorders, to screen for problems with the kidneys and liver, to  screen for bladder problems, to check blood sugar, and to check your cholesterol level.  Preventative services generally include counseling about diet, exercise, avoiding tobacco, drugs, excessive alcohol consumption, and sexually transmitted infections.    GENERAL RECOMMENDATIONS FOR GOOD HEALTH:  Healthy diet:  Eat a variety of foods, including fruit, vegetables, animal or vegetable protein, such as meat, fish, chicken, and eggs, or beans, lentils, tofu, and grains, such as rice.  Drink plenty of water daily.  Decrease saturated fat in the diet, avoid lots of red meat, processed foods, sweets, fast foods, and fried foods.  Exercise:  Aerobic exercise helps maintain good heart health. At least 30-40 minutes of moderate-intensity exercise is recommended. For example, a brisk walk that increases your heart rate and breathing. This should be done on most days of the week.   Find a type of exercise or a variety of exercises that you enjoy so that it becomes a part of your daily life.  Examples are running, walking, swimming, water aerobics, and biking.  For motivation and support, explore group exercise such as aerobic class, spin class, Zumba, Yoga,or  martial arts, etc.    Set exercise goals for yourself, such as a certain weight goal, walk or run in a race such as a 5k walk/run.  Speak to your primary care provider about exercise goals.  Disease prevention:  If you smoke or chew tobacco, find out from your caregiver how to quit. It can literally save your life, no matter  how long you have been a tobacco user. If you do not use tobacco, never begin.   Maintain a healthy diet and normal weight. Increased weight leads to problems with blood pressure and diabetes.   The Body Mass Index or BMI is a way of measuring how much of your body is fat. Having a BMI above 27 increases the risk of heart disease, diabetes, hypertension, stroke and other problems related to obesity. Your caregiver can  help determine your BMI and based on it develop an exercise and dietary program to help you achieve or maintain this important measurement at a healthful level.  High blood pressure causes heart and blood vessel problems.  Persistent high blood pressure should be treated with medicine if weight loss and exercise do not work.   Fat and cholesterol leaves deposits in your arteries that can block them. This causes heart disease and vessel disease elsewhere in your body.  If your cholesterol is found to be high, or if you have heart disease or certain other medical conditions, then you may need to have your cholesterol monitored frequently and be treated with medication.   Ask if you should have a cardiac stress test if your history suggests this. A stress test is a test done on a treadmill that looks for heart disease. This test can find disease prior to there being a problem.  Osteoporosis is a disease in which the bones lose minerals and strength as we age. This can result in serious bone fractures. Risk of osteoporosis can be identified using a bone density scan. Men ages 47 and over should discuss this with their caregivers. Ask your caregiver whether you should be taking a calcium supplement and Vitamin D, to reduce the rate of osteoporosis.   Avoid drinking alcohol in excess (more than two drinks per day).  Avoid use of street drugs. Do not share needles with anyone. Ask for professional help if you need assistance or instructions on stopping the use of alcohol, cigarettes, and/or drugs.  Brush your teeth twice a day with fluoride toothpaste, and floss once a day. Good oral hygiene prevents tooth decay and gum disease. The problems can be painful, unattractive, and can cause other health problems. Visit your dentist for a routine oral and dental check up and preventive care every 6-12 months.   Look at your skin regularly.  Use a mirror to look at your back. Notify your caregivers of changes in  moles, especially if there are changes in shapes, colors, a size larger than a pencil eraser, an irregular border, or development of new moles.  Safety:  Use seatbelts 100% of the time, whether driving or as a passenger.  Use safety devices such as hearing protection if you work in environments with loud noise or significant background noise.  Use safety glasses when doing any work that could send debris in to the eyes.  Use a helmet if you ride a bike or motorcycle.  Use appropriate safety gear for contact sports.  Talk to your caregiver about gun safety.  Use sunscreen with a SPF (or skin protection factor) of 15 or greater.  Lighter skinned people are at a greater risk of skin cancer. Don't forget to also wear sunglasses in order to protect your eyes from too much damaging sunlight. Damaging sunlight can accelerate cataract formation.   Practice safe sex. Use condoms. Condoms are used for birth control and to help reduce the spread of sexually transmitted infections (or STIs).  Some of  the STIs are gonorrhea (the clap), chlamydia, syphilis, trichomonas, herpes, HPV (human papilloma virus) and HIV (human immunodeficiency virus) which causes AIDS. The herpes, HIV and HPV are viral illnesses that have no cure. These can result in disability, cancer and death.   Keep carbon monoxide and smoke detectors in your home functioning at all times. Change the batteries every 6 months or use a model that plugs into the wall.   Vaccinations:  Stay up to date with your tetanus shots and other required immunizations. You should have a booster for tetanus every 10 years. Be sure to get your flu shot every year, since 5%-20% of the [place] population comes down with the flu. The flu vaccine changes each year, so being vaccinated once is not enough. Get your shot in the fall, before the flu season peaks.   Other vaccines to consider:  Human Papilloma Virus or HPV causes cancer of the cervix, and other infections that  can be transmitted from person to person. There is a vaccine for HPV, and males should get immunized between the ages of 5 and 36. It requires a series of 3 shots.   Pneumococcal vaccine to protect against certain types of pneumonia.  This is normally recommended for adults age 64 or older.  However, adults younger than 47 years old with certain underlying conditions such as diabetes, heart or lung disease should also receive the vaccine.  Shingles vaccine to protect against Varicella Zoster if you are older than age 34, or younger than 47 years old with certain underlying illness.  If you have not had the Shingrix vaccine, please call your insurer to inquire about coverage for the Shingrix vaccine given in 2 doses.   Some insurers cover this vaccine after age 24, some cover this after age 2.  If your insurer covers this, then call to schedule appointment to have this vaccine here  Hepatitis A vaccine to protect against a form of infection of the liver by a virus acquired from food.  Hepatitis B vaccine to protect against a form of infection of the liver by a virus acquired from blood or body fluids, particularly if you work in health care.  If you plan to travel internationally, check with your local health department for specific vaccination recommendations.   What should I know about cancer screening? Many types of cancers can be detected early and may often be prevented. Lung Cancer  You should be screened every year for lung cancer if: ? You are a current smoker who has smoked for at least 30 years. ? You are a former smoker who has quit within the past 15 years.  Talk to your health care provider about your screening options, when you should start screening, and how often you should be screened.  Colorectal Cancer  Routine colorectal cancer screening usually begins at 47 years of age and should be repeated every 5-10 years until you are 47 years old. You may need to be screened more  often if early forms of precancerous polyps or small growths are found. Your health care provider may recommend screening at an earlier age if you have risk factors for colon cancer.  Your health care provider may recommend using home test kits to check for hidden blood in the stool.  A small camera at the end of a tube can be used to examine your colon (sigmoidoscopy or colonoscopy). This checks for the earliest forms of colorectal cancer.  Prostate and Testicular Cancer  Depending on  your age and overall health, your health care provider may do certain tests to screen for prostate and testicular cancer.  Talk to your health care provider about any symptoms or concerns you have about testicular or prostate cancer.  Skin Cancer  Check your skin from head to toe regularly.  Tell your health care provider about any new moles or changes in moles, especially if: ? There is a change in a mole's size, shape, or color. ? You have a mole that is larger than a pencil eraser.  Always use sunscreen. Apply sunscreen liberally and repeat throughout the day.  Protect yourself by wearing long sleeves, pants, a wide-brimmed hat, and sunglasses when outside.

## 2018-05-12 NOTE — Progress Notes (Signed)
Subjective:   HPI  Chase Rogers is a 47 y.o. male who presents for Chief Complaint  Patient presents with  . CPE    fasting cpe     Medical care team includes: Leshaun Biebel, Camelia Eng, PA-C here for primary care Dentist Eye doctor  Concerns: He request bumping up the Wellbutrin to twice daily.  He has been on this in the past during the winter months.  He attributes some of this increased and depressed mood to he used to foster to girls, and 1 of those birthdays were before Christmas and 1 just after Christmas.  He gets sad around this time a year.  He did fine on Wellbutrin 150 twice daily in the past.  He is not exercising regularly  He got a flu shot at pharmacy recently  No other complaint  Reviewed their medical, surgical, family, social, medication, and allergy history and updated chart as appropriate.  Past Medical History:  Diagnosis Date  . Allergy   . Congenital single kidney    per prior ultrasound and other imaging  . Depression   . GERD (gastroesophageal reflux disease)   . History of EKG 2016   normal per report  . Hyperlipidemia   . Hypertension 2004  . Migraine   . Wart 2014   finger  . Wears contact lenses     Past Surgical History:  Procedure Laterality Date  . COLONOSCOPY  2012   abdominal pain, brother hx/o Crohns.  normal per patient  . KNEE SURGERY  2007   right meniscal tear  . MINOR FULGERATION OF ANAL CONDYLOMA  2013    Social History   Socioeconomic History  . Marital status: Married    Spouse name: Not on file  . Number of children: Not on file  . Years of education: Not on file  . Highest education level: Not on file  Occupational History  . Not on file  Social Needs  . Financial resource strain: Not on file  . Food insecurity:    Worry: Not on file    Inability: Not on file  . Transportation needs:    Medical: Not on file    Non-medical: Not on file  Tobacco Use  . Smoking status: Heavy Tobacco Smoker    Packs/day:  0.50    Types: Cigarettes  . Smokeless tobacco: Never Used  Substance and Sexual Activity  . Alcohol use: Yes    Alcohol/week: 6.0 standard drinks    Types: 2 Glasses of wine, 2 Cans of beer, 2 Shots of liquor per week  . Drug use: No  . Sexual activity: Yes  Lifestyle  . Physical activity:    Days per week: Not on file    Minutes per session: Not on file  . Stress: Not on file  Relationships  . Social connections:    Talks on phone: Not on file    Gets together: Not on file    Attends religious service: Not on file    Active member of club or organization: Not on file    Attends meetings of clubs or organizations: Not on file    Relationship status: Not on file  . Intimate partner violence:    Fear of current or ex partner: Not on file    Emotionally abused: Not on file    Physically abused: Not on file    Forced sexual activity: Not on file  Other Topics Concern  . Not on file  Social History Narrative  Lives with husband.  Exercise -[employer] regularly.   Eats relatively healthy.   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 Arthritis Mother   . Hyperlipidemia Mother   . Hypertension Mother   . Cancer Mother        melanoma  . Arthritis Father        TKR  . Crohn's disease Brother   . Cancer Maternal Grandmother        breast, stomach  . Heart disease Maternal Grandfather        MI  . Cancer Paternal Grandmother        colon  . Heart disease Paternal Uncle        multiple MIs     Current Outpatient Medications:  .  buPROPion (WELLBUTRIN SR) 150 MG 12 hr tablet, Take 1 tablet (150 mg total) by mouth 2 (two) times daily., Disp: 180 tablet, Rfl: 0 .  esomeprazole (NEXIUM) 20 MG capsule, Take 20 mg by mouth daily at 12 noon., Disp: , Rfl:  .  lisinopril (PRINIVIL,ZESTRIL) 5 MG tablet, Take 1 tablet (5 mg total) by mouth daily., Disp: 90 tablet, Rfl: 3 .  Multiple Vitamin (MULTIVITAMIN) tablet,  Take 1 tablet by mouth daily., Disp: , Rfl:  .  Omega-3 Fatty Acids (FISH OIL) 1000 MG CPDR, Take by mouth daily., Disp: , Rfl:  .  simvastatin (ZOCOR) 20 MG tablet, TAKE 1 TABLET BY MOUTH EVERY DAY, Disp: 90 tablet, Rfl: 0  Allergies  Allergen Reactions  . Erythromycin     Age 11, unsure of allergy       Review of Systems Constitutional: -fever, -chills, -sweats, -unexpected weight change, -decreased appetite, -fatigue Allergy: -sneezing, -itching, -congestion Dermatology: -changing moles, --rash, -lumps ENT: -runny nose, -ear pain, -sore throat, -hoarseness, -sinus pain, -teeth pain, - ringing in ears, -hearing loss, -nosebleeds Cardiology: -chest pain, -palpitations, -swelling, -difficulty breathing when lying flat, -waking up short of breath Respiratory: -cough, -shortness of breath, -difficulty breathing with exercise or exertion, -wheezing, -coughing up blood Gastroenterology: -abdominal pain, -nausea, -vomiting, -diarrhea, -constipation, -blood in stool, -changes in bowel movement, -difficulty swallowing or eating Hematology: -bleeding, -bruising  Musculoskeletal: -joint aches, -muscle aches, -joint swelling, -back pain, -neck pain, -cramping, -changes in gait Ophthalmology: denies vision changes, eye redness, itching, discharge Urology: -burning with urination, -difficulty urinating, -blood in urine, -urinary frequency, -urgency, -incontinence Neurology: -headache, -weakness, -tingling, -numbness, -memory loss, -falls, -dizziness Psychology: +depressed mood, -agitation, -sleep problems Male GU: no testicular mass, pain, no lymph nodes swollen, no swelling, no rash.     Objective:  BP 122/70   Pulse 87   Temp 98.1 F (36.7 C) (Oral)   Resp 16   Ht 5\' 11"  (1.803 m)   Wt 179 lb 3.2 oz (81.3 kg)   SpO2 98%   BMI 24.99 kg/m   General appearance: alert, no distress, WD/WN, Caucasian male Skin: left scrotum with several small 2-3 mm diameter red brown macules unchanged for  years, several scattered macules, no worrisome lesions, left upper back with tattoo of a little red devil, right inguinal region with tattoo of a red scorpion HEENT: normocephalic, conjunctiva/corneas normal, sclerae anicteric, PERRLA, EOMi, nares patent, no discharge or erythema, pharynx normal Oral cavity: MMM, tongue normal, teeth in good repair Neck: supple, no lymphadenopathy, no thyromegaly, no masses, normal ROM, no bruits Chest: non tender, normal shape and expansion Heart: RRR, normal S1, S2, no murmurs Lungs: CTA bilaterally, no  wheezes, rhonchi, or rales Abdomen: +bs, soft, non tender, non distended, no masses, no hepatomegaly, no splenomegaly, no bruits Back: non tender, normal ROM, no scoliosis Musculoskeletal: upper extremities non tender, no obvious deformity, normal ROM throughout, lower extremities non tender, no obvious deformity, normal ROM throughout Extremities: no edema, no cyanosis, no clubbing Pulses: 2+ symmetric, upper and lower extremities, normal cap refill Neurological: alert, oriented x 3, CN2-12 intact, strength normal upper extremities and lower extremities, sensation normal throughout, DTRs 2+ throughout, no cerebellar signs, gait normal Psychiatric: normal affect, behavior normal, pleasant  GU: normal male external genitalia,circumcised, nontender, no masses, no hernia, no lymphadenopathy Rectal: deferred   Assessment and Plan :   Encounter Diagnoses  Name Primary?  . Routine general medical examination at a health care facility Yes  . Encounter for health maintenance examination in adult   . Essential hypertension   . Gastroesophageal reflux disease without esophagitis   . Depression, unspecified depression type   . Hyperlipidemia, unspecified hyperlipidemia type   . Screen for STD (sexually transmitted disease)   . History of syphilis   . Family history of melanoma   . Solitary kidney   . Need for Tdap vaccination     Physical exam - discussed  and counseled on healthy lifestyle, diet, exercise, preventative care, vaccinations, sick and well care, proper use of emergency dept and after hours care, and addressed their concerns.    Health screening: See your eye doctor yearly for routine vision care. See your dentist yearly for routine dental care including hygiene visits twice yearly.  Discussed STD testing, discussed prevention, condom use, means of transmission  Cancer screening Advised monthly self skin surveillance given family history.   Discussed PSA and colonoscopy age 15, testicular exams  Vaccinations: Advised yearly influenza vaccine  Counseled on the Tdap (tetanus, diptheria, and acellular pertussis) vaccine.  Vaccine information sheet given. Tdap vaccine given after consent obtained.   Acute issues discussed: depression - increase Wellbutrin to BID during winter months.  He has done well on this prior.  Advised f/u 24mo   Separate significant chronic issues discussed: Hypertension, solitary kidney, continue lisinopril  GERD- he uses over-the-counter Nexium as needed, avoid GERD triggers  Advised to consider yearly follow-up with dermatology for surveillance and screening  History of syphilis- f/u pending labs.   He was treated with a series of penicillin within the past year.  Dayvon was seen today for cpe.  Diagnoses and all orders for this visit:  Routine general medical examination at a health care facility -     POCT Urinalysis DIP (Proadvantage Device) -     Comprehensive metabolic panel -     CBC with Differential/Platelet -     Lipid panel -     HIV Antibody (routine testing w rflx) -     GC/Chlamydia Probe Amp -     RPR  Encounter for health maintenance examination in adult  Essential hypertension  Gastroesophageal reflux disease without esophagitis  Depression, unspecified depression type  Hyperlipidemia, unspecified hyperlipidemia type -     Lipid panel  Screen for STD (sexually  transmitted disease) -     HIV Antibody (routine testing w rflx) -     GC/Chlamydia Probe Amp -     RPR  History of syphilis -     RPR  Family history of melanoma  Solitary kidney -     Comprehensive metabolic panel  Need for Tdap vaccination -     Tdap vaccine greater than  or equal to 7yo IM  Other orders -     buPROPion (WELLBUTRIN SR) 150 MG 12 hr tablet; Take 1 tablet (150 mg total) by mouth 2 (two) times daily. -     lisinopril (PRINIVIL,ZESTRIL) 5 MG tablet; Take 1 tablet (5 mg total) by mouth daily.   Follow-up pending labs, yearly for physical

## 2018-05-15 ENCOUNTER — Other Ambulatory Visit: Payer: Self-pay | Admitting: Medical

## 2018-05-15 LAB — LIPID PANEL
Chol/HDL Ratio: 4.6 ratio (ref 0.0–5.0)
Cholesterol, Total: 205 mg/dL — ABNORMAL HIGH (ref 100–199)
HDL: 45 mg/dL (ref >39)
LDL Calculated: 108 mg/dL — ABNORMAL HIGH (ref 0–99)
Triglycerides: 261 mg/dL — ABNORMAL HIGH (ref 0–149)
VLDL Cholesterol Cal: 52 mg/dL — ABNORMAL HIGH (ref 5–40)

## 2018-05-15 LAB — COMPREHENSIVE METABOLIC PANEL
ALT: 34 IU/L (ref 0–44)
AST: 32 IU/L (ref 0–40)
Albumin/Globulin Ratio: 1.9 (ref 1.2–2.2)
Albumin: 4.9 g/dL (ref 3.5–5.5)
Alkaline Phosphatase: 103 IU/L (ref 39–117)
BUN/Creatinine Ratio: 13 (ref 9–20)
BUN: 14 mg/dL (ref 6–24)
Bilirubin Total: 0.3 mg/dL (ref 0.0–1.2)
CO2: 21 mmol/L (ref 20–29)
Calcium: 10.2 mg/dL (ref 8.7–10.2)
Chloride: 100 mmol/L (ref 96–106)
Creatinine, Ser: 1.07 mg/dL (ref 0.76–1.27)
GFR calc Af Amer: 95 mL/min/{1.73_m2} (ref >59)
GFR calc non Af Amer: 82 mL/min/{1.73_m2} (ref >59)
Globulin, Total: 2.6 g/dL (ref 1.5–4.5)
Glucose: 103 mg/dL — ABNORMAL HIGH (ref 65–99)
Potassium: 4.8 mmol/L (ref 3.5–5.2)
Sodium: 138 mmol/L (ref 134–144)
Total Protein: 7.5 g/dL (ref 6.0–8.5)

## 2018-05-15 LAB — CBC WITH DIFFERENTIAL/PLATELET
Basophils Absolute: 0 10*3/uL (ref 0.0–0.2)
Basos: 1 %
EOS (ABSOLUTE): 0.1 10*3/uL (ref 0.0–0.4)
Eos: 2 %
Hematocrit: 50.3 % (ref 37.5–51.0)
Hemoglobin: 16.5 g/dL (ref 13.0–17.7)
Immature Grans (Abs): 0 10*3/uL (ref 0.0–0.1)
Immature Granulocytes: 1 %
Lymphocytes Absolute: 1.4 10*3/uL (ref 0.7–3.1)
Lymphs: 27 %
MCH: 29.5 pg (ref 26.6–33.0)
MCHC: 32.8 g/dL (ref 31.5–35.7)
MCV: 90 fL (ref 79–97)
Monocytes Absolute: 0.6 10*3/uL (ref 0.1–0.9)
Monocytes: 12 %
Neutrophils Absolute: 3 10*3/uL (ref 1.4–7.0)
Neutrophils: 57 %
Platelets: 442 10*3/uL (ref 150–450)
RBC: 5.6 x10E6/uL (ref 4.14–5.80)
RDW: 12.6 % (ref 12.3–15.4)
WBC: 5.1 10*3/uL (ref 3.4–10.8)

## 2018-05-15 LAB — RPR, QUANT+TP ABS (REFLEX)
Rapid Plasma Reagin, Quant: 1:1 {titer} — ABNORMAL HIGH
T Pallidum Abs: REACTIVE — AB

## 2018-05-15 LAB — HIV ANTIBODY (ROUTINE TESTING W REFLEX): HIV Screen 4th Generation wRfx: NONREACTIVE

## 2018-05-15 LAB — RPR: RPR Ser Ql: REACTIVE — AB

## 2018-05-15 MED ORDER — SIMVASTATIN 20 MG PO TABS: 20.0000 mg | ORAL_TABLET | Freq: Every day | ORAL | 3 refills | Status: DC

## 2018-05-16 ENCOUNTER — Other Ambulatory Visit: Payer: Self-pay

## 2018-05-16 ENCOUNTER — Other Ambulatory Visit: Payer: Self-pay | Admitting: Medical

## 2018-05-16 LAB — GC/CHLAMYDIA PROBE AMP
Chlamydia trachomatis, NAA: NEGATIVE
Neisseria gonorrhoeae by PCR: NEGATIVE

## 2018-05-16 MED ORDER — ICOSAPENT ETHYL 1 G PO CAPS: 2.0000 | ORAL_CAPSULE | Freq: Two times a day (BID) | ORAL | 11 refills | Status: DC

## 2018-07-06 ENCOUNTER — Other Ambulatory Visit: Payer: Self-pay | Admitting: Medical

## 2018-07-28 ENCOUNTER — Telehealth: Payer: Self-pay | Admitting: Medical

## 2018-07-28 ENCOUNTER — Ambulatory Visit: Payer: BLUE CROSS/BLUE SHIELD | Admitting: Medical

## 2018-07-28 ENCOUNTER — Encounter: Payer: Self-pay | Admitting: Medical

## 2018-07-28 VITALS — BP 110/72 | HR 95 | Temp 97.9°F | Ht 72.0 in | Wt 177.0 lb

## 2018-07-28 DIAGNOSIS — K921 Melena: Secondary | ICD-10-CM | POA: Diagnosis not present

## 2018-07-28 DIAGNOSIS — N5082 Scrotal pain: Secondary | ICD-10-CM | POA: Diagnosis not present

## 2018-07-28 DIAGNOSIS — K649 Unspecified hemorrhoids: Secondary | ICD-10-CM | POA: Diagnosis not present

## 2018-07-28 MED ORDER — DOXYCYCLINE HYCLATE 100 MG PO TABS: 100.0000 mg | ORAL_TABLET | Freq: Two times a day (BID) | ORAL | 0 refills | Status: DC

## 2018-07-28 MED ORDER — HYDROCORTISONE ACETATE 25 MG RE SUPP: 25.0000 mg | Freq: Two times a day (BID) | RECTAL | 0 refills | Status: DC

## 2018-07-28 MED ORDER — HYDROCORTISONE 2.5 % RE CREA: 1.0000 "application " | TOPICAL_CREAM | Freq: Two times a day (BID) | RECTAL | 0 refills | Status: DC

## 2018-07-28 NOTE — Telephone Encounter (Signed)
Recv'd fax from [facility] that [facility] AC suppository not covered by ins and cost $160.  They processed with coupon and cost $55.  Pt informed

## 2018-07-28 NOTE — Progress Notes (Signed)
  Subjective:     Patient ID: Chase Rogers, male   DOB: 1970/06/30, 48 y.o.   MRN: [id number]  HPI Chief Complaint  Patient presents with  . Hemorrhoids    pain with blood in stool and on tissue    He reports having burning and itching at the anus earlier in the week but then within the next day had rectal pain.  The next day after that which was 4 days ago had bright red blood on the toilet paper and red-pink water in the toilet bowl, had ongoing pain that day within the last 2 days had no pain but just blood on the toilet paper again.  Usually does not have blood in the stool.  Rarely has problems with bowels no constipation.  He normally has 1 or 2 soft formed bowel movements daily.  He has new discomfort behind the scrotum that started yesterday which is what prompted the visit as well as the blood.  No abdominal pain, no back pain, no fever, no nausea or vomiting.  No concern for STD.  No dysuria.  No penile discharge.  No other aggravating or relieving factors. No other complaint.  Review of Systems As in subjective    Objective:   Physical Exam BP 110/72   Pulse 95   Temp 97.9 F (36.6 C) (Oral)   Ht 6' (1.829 m)   Wt 177 lb (80.3 kg)   SpO2 97%   BMI 24.01 kg/m   Gen: wd, wn, nad Anus and perirectal area with no obvious deformity, there is slight tenderness at the posterior anus, fullness in the same area, but no induration, no erythema No scrotal tenderness no lymphadenopathy noted testicle lump that he is having some general tenderness with palpation just inferior to the scrotal in the perineal area     Assessment:     Encounter Diagnoses  Name Primary?  . Hemorrhoids, unspecified hemorrhoid type Yes  . Blood in stool   . Scrotal pain        Plan:     Symptoms and exam suggest inflamed internal hemorrhoids, that is likely improving on his own.  Continue good water and fiber intake to avoid hemorrhoids and constipation, avoid straining on the toilet for long  periods, use sitz bath or hot bath soaks for 20 minutes twice daily, begin suppository for the next 3 to 5 days, if needed can use the hemorrhoidal cream below.  Since he has scrotal discomfort, discussed the possibility of orchitis or prostate infection in the works is not obvious yet.  If worsening signs of prostate orchitis infection as we discussed and begin doxycycline.  Call report recheck next week  If much worse over the weekend get reevaluated or call the after-hours line

## 2018-08-04 ENCOUNTER — Other Ambulatory Visit: Payer: Self-pay | Admitting: Medical

## 2018-08-04 NOTE — Telephone Encounter (Signed)
Is this ok to refill?  

## 2018-10-30 ENCOUNTER — Other Ambulatory Visit: Payer: Self-pay | Admitting: Medical

## 2018-10-30 NOTE — Telephone Encounter (Signed)
Is this ok to refill?  

## 2018-11-15 ENCOUNTER — Other Ambulatory Visit: Payer: Self-pay

## 2018-11-15 ENCOUNTER — Ambulatory Visit: Payer: BC Managed Care – PPO | Admitting: Medical

## 2018-11-15 VITALS — BP 130/82 | HR 95 | Temp 98.7°F | Resp 16 | Ht 72.0 in | Wt 176.4 lb

## 2018-11-15 DIAGNOSIS — Z905 Acquired absence of kidney: Secondary | ICD-10-CM | POA: Diagnosis not present

## 2018-11-15 DIAGNOSIS — R002 Palpitations: Secondary | ICD-10-CM

## 2018-11-15 DIAGNOSIS — I10 Essential (primary) hypertension: Secondary | ICD-10-CM | POA: Diagnosis not present

## 2018-11-15 DIAGNOSIS — Z566 Other physical and mental strain related to work: Secondary | ICD-10-CM | POA: Insufficient documentation

## 2018-11-15 DIAGNOSIS — G479 Sleep disorder, unspecified: Secondary | ICD-10-CM | POA: Diagnosis not present

## 2018-11-15 MED ORDER — LORAZEPAM 0.5 MG PO TABS: 0.5000 mg | ORAL_TABLET | Freq: Every day | ORAL | 0 refills | Status: DC

## 2018-11-15 NOTE — Progress Notes (Signed)
Subjective: Chief Complaint  Patient presents with  . bp    shakey, feels like bp is up.  flush feeling, nauseated, dizzy, X 1 day   Here for not feeling well.  Yesterday felt unusual after getting out of the shower.  Felt like BP was high.  But when he checked it was a little higher than normal.  All day yesterday heart felt like its pounding, feels dizzy, nauseated.   Felt cold at times.   No sweats.  .   At times feels like he has to focus on breathing or he will hyperventilate.  Feels some sharp occasional pains in left chest.   Had similar pain in left chest last week.    Not sleeping well for weeks.  Sleeping 4-5 hours per day but wakes up all through the night, tossing and turning.   Mind is going constantly.   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  Is a Arboriculturist at [employer].  Eats sometimes 1, 2 or 3 times daily.  Only drinks 1 cup of coffee daily, no other caffeine.  But feels like he is drinking energy drinks the way he feels.   Can't sleep well currently, head is constantly going.  Wonders if this is just anxiety or if something is going on with his body.  Has congenital single kidney.  Denies excessive alcohol use, no drug use.   No other aggravating or relieving factors. No other complaint.  Past Medical History:  Diagnosis Date  . Allergy   . Congenital single kidney    per prior ultrasound and other imaging  . Depression   . GERD (gastroesophageal reflux disease)   . History of EKG 2016   normal per report  . Hyperlipidemia   . Hypertension 2004  . Migraine   . Wart 2014   finger  . Wears contact lenses    Current Outpatient Medications on File Prior to Visit  Medication Sig Dispense Refill  . buPROPion (WELLBUTRIN SR) 150 MG 12 hr tablet TAKE 1 TABLET BY MOUTH TWICE A DAY 180 tablet 0  . esomeprazole (NEXIUM) 20 MG capsule Take 20 mg by mouth daily at 12 noon.    Marland Kitchen lisinopril (PRINIVIL,ZESTRIL) 5 MG tablet Take 1 tablet (5 mg total) by  mouth daily. 90 tablet 3  . Multiple Vitamin (MULTIVITAMIN) tablet Take 1 tablet by mouth daily.    . simvastatin (ZOCOR) 20 MG tablet Take 1 tablet (20 mg total) by mouth daily. 90 tablet 3  . VASCEPA 1 g CAPS TAKE 2 CAPSULES TWICE A DAY 360 capsule 4   No current facility-administered medications on file prior to visit.    ROS as in subjective   Objective: BP 130/82   Pulse 95   Temp 98.7 F (37.1 C) (Oral)   Resp 16   Ht 6' (1.829 m)   Wt 176 lb 6.4 oz (80 kg)   SpO2 99%   BMI 23.92 kg/m   Wt Readings from Last 3 Encounters:  11/15/18 176 lb 6.4 oz (80 kg)  07/28/18 177 lb (80.3 kg)  05/12/18 179 lb 3.2 oz (81.3 kg)    General appearance: alert, no distress, WD/WN, lean white male Oral cavity: MMM, no lesions Neck: supple, no lymphadenopathy, no thyromegaly, no masses Heart: RRR, normal S1, S2, no murmurs Lungs: CTA bilaterally, no wheezes, rhonchi, or rales Abdomen: +bs, soft, non tender, non distended, no masses, no hepatomegaly, no splenomegaly, no bruits Extremities: no edema, no  cyanosis, no clubbing Pulses: 2+ symmetric, upper and lower extremities, normal cap refill Neurological: alert, oriented x 3, CN2-12 intact, strength normal upper extremities and lower extremities, sensation normal throughout, DTRs 2+ throughout, no cerebellar signs, gait normal Psychiatric: normal affect, behavior normal, pleasant   EKG  indication palpitations, rate 85 bpm, normal sinus rhythm, PR interval 156 ms, QRS 86 ms, QTC 411 ms, axis 40 degrees, normal EKG   Assessment: Encounter Diagnoses  Name Primary?  . Palpitation Yes  . Sleep disturbance   . Essential hypertension, benign   . Single kidney   . Work stress      Plan: EKG reviewed, unremarkable.  We discussed his concerns today.  I suspect most of this is anxiety related and with the poor sleep, he is having the above symptoms.  We will use a short-term Ativan to help with sleep and help with anxiety.  We discussed  ways to cope with anxiety and stress.  We discussed setting some boundaries with work.  We discussed some ways to work on sleep hygiene.  We discussed Ativan being short-term use.  Discussed proper use of medication.  Gave the option of BMET lab today to double check electrolytes and kidney function.  He declines lab today.   We will plan to check a renal ultrasound in the near future when he is ready.  He will let me know.  I asked him to follow-up in a week to let me know how things are going.  He voices agreement and understanding of plan  [facility] was seen today for bp.  Diagnoses and all orders for this visit:  Palpitation -     EKG 12-Lead  Sleep disturbance  Essential hypertension, benign -     EKG 12-Lead  Single kidney  Work stress  Other orders -     LORazepam (ATIVAN) 0.5 MG tablet; Take 1 tablet (0.5 mg total) by mouth at bedtime.

## 2018-11-21 ENCOUNTER — Other Ambulatory Visit: Payer: Self-pay | Admitting: Medical

## 2018-11-21 MED ORDER — HYDROXYZINE HCL 10 MG PO TABS: 10.0000 mg | ORAL_TABLET | Freq: Three times a day (TID) | ORAL | 0 refills | Status: DC | PRN

## 2019-01-29 ENCOUNTER — Other Ambulatory Visit: Payer: Self-pay | Admitting: Medical

## 2019-02-26 ENCOUNTER — Encounter: Payer: Self-pay | Admitting: Family Medicine

## 2019-02-26 ENCOUNTER — Ambulatory Visit: Payer: BC Managed Care – PPO | Admitting: Family Medicine

## 2019-02-26 ENCOUNTER — Other Ambulatory Visit: Payer: Self-pay

## 2019-02-26 VITALS — BP 133/85 | HR 115 | Temp 98.8°F | Wt 180.0 lb

## 2019-02-26 DIAGNOSIS — R0982 Postnasal drip: Secondary | ICD-10-CM | POA: Diagnosis not present

## 2019-02-26 DIAGNOSIS — R5383 Other fatigue: Secondary | ICD-10-CM | POA: Diagnosis not present

## 2019-02-26 DIAGNOSIS — J392 Other diseases of pharynx: Secondary | ICD-10-CM

## 2019-02-26 DIAGNOSIS — R52 Pain, unspecified: Secondary | ICD-10-CM | POA: Diagnosis not present

## 2019-02-26 NOTE — Progress Notes (Signed)
   Subjective:  Documentation for virtual audio and video telecommunications through [facility] encounter:  The patient was located at home. 2 patient identifiers used.  The provider was located in the office. The patient did consent to this visit and is aware of possible charges through their insurance for this visit.  The other persons participating in this telemedicine service were none.    Patient ID: Chase Rogers, male    DOB: Oct 23, 1970, 48 y.o.   MRN: [id number]  HPI Chief Complaint  Patient presents with  . Body aches    body aches x 1 week and mild headache. had migraine 2 weeks ago as well. clearing of the throat. no fever, no SOB, otc tylenol    Complains of a 4-5 day history of body aches, headache, fatigue, nasal congestion, post nasal drainage, scratchy throat.   States he a migraine headache that was his usual headache 2 weeks ago.  He is taking excedrin migraine. This helps with current dull headache.   Denies fever, chills, chest pain, palpitations, shortness of breath, abdominal pain, N/V/D.  No loss of taste or smell.   States he works out at his gym.  His husband is also having similar symptoms.   Reviewed allergies, medications, past medical, surgical, family, and social history.   Review of Systems Pertinent positives and negatives in the history of present illness.     Objective:   Physical Exam BP 133/85   Pulse (!) 115   Temp 98.8 F (37.1 C) (Oral)   Wt 180 lb (81.6 kg)   BMI 24.41 kg/m   Alert and oriented in no acute distress.  Respirations unlabored.  Normal speech, mood and thought process.      Assessment & Plan:  Generalized body aches  Other fatigue  Throat irritation  Post-nasal drainage  Virtual visit for acute symptoms of fatigue, body aches and URI symptoms.   No red flag symptoms. Recommend that he go to the [facility] for COVID-19 testing.  We also discussed supportive care with Tylenol, hydration, salt  water gargles, Chloraseptic if needed. He will follow-up if symptoms are worsening or if he is not improving in the next 2 to 3 days.  Time spent on call was 14 minutes and in review of previous records 2 minutes total.  This virtual service is not related to other E/M service within previous 7 days.

## 2019-02-27 ENCOUNTER — Other Ambulatory Visit: Payer: Self-pay

## 2019-02-27 DIAGNOSIS — Z20828 Contact with and (suspected) exposure to other viral communicable diseases: Secondary | ICD-10-CM | POA: Diagnosis not present

## 2019-02-27 DIAGNOSIS — Z20822 Contact with and (suspected) exposure to covid-19: Secondary | ICD-10-CM

## 2019-03-01 LAB — NOVEL CORONAVIRUS, NAA: SARS-CoV-2, NAA: NOT DETECTED

## 2019-04-23 ENCOUNTER — Ambulatory Visit: Payer: BC Managed Care – PPO | Admitting: Medical

## 2019-04-23 ENCOUNTER — Encounter: Payer: Self-pay | Admitting: Medical

## 2019-04-23 ENCOUNTER — Other Ambulatory Visit: Payer: Self-pay

## 2019-04-23 VITALS — Ht 72.0 in | Wt 180.0 lb

## 2019-04-23 DIAGNOSIS — R21 Rash and other nonspecific skin eruption: Secondary | ICD-10-CM | POA: Diagnosis not present

## 2019-04-23 DIAGNOSIS — K140 Glossitis: Secondary | ICD-10-CM | POA: Diagnosis not present

## 2019-04-23 MED ORDER — HYDROXYZINE HCL 10 MG PO TABS: 10.0000 mg | ORAL_TABLET | Freq: Two times a day (BID) | ORAL | 0 refills | Status: DC

## 2019-04-23 NOTE — Progress Notes (Signed)
This visit type was conducted due to national recommendations for restrictions regarding the COVID-19 Pandemic (e.g. social distancing) in an effort to limit this patient's exposure and mitigate transmission in our community.  Due to their co-morbid illnesses, this patient is at least at moderate risk for complications without adequate follow up.  This format is felt to be most appropriate for this patient at this time.    Documentation for virtual audio and video telecommunications through Zoom encounter:  The patient was located at home. The provider was located in the office. The patient did consent to this visit and is aware of possible charges through their insurance for this visit.  The other persons participating in this telemedicine service were none. Time spent on call was 20 minutes and in review of previous records >20 minutes total.  This virtual service is not related to other E/M service within previous 7 days.  Subjective: Chief Complaint  Patient presents with  . Sore Throat  . Rash    all over    Consult today about tongue issues and rash.  He notes a few weeks ago his tongue started feeling sore.  Around that time he had started drinking a nightly glass of red wine.  1 particular night after dinner after eating tomatoes his tongue actually felt swollen and very sore.  He cut back on eating tomatoes and acidic foods at that point.  He also cut back on the wine.  However the tongue continue to feel sore.  Did not really swell up anymore.  His lips never got swollen.  He has tried to limit acidic foods since then but the tongue continues to feel similar.  Then he just started noticing a rash within the last week that started on her torso that has now spread to the arms and thighs and hands including the palms.  He has not really been itchy until Thanksgiving day a few days ago.  He takes Nexium regularly, takes multivitamin every day, eats a good body of fruits and vegetables.   He has been using vitamin D supplement and zinc supplement.  He wondered about canker sores but does not see any specific source.  No sick contacts.  He did recently change soaps thinking the rash could be related to the soap but that has not changed anything.  No prior similar symptoms.  No other aggravating or relieving factors. No other complaint.  Past Medical History:  Diagnosis Date  . Allergy   . Congenital single kidney    per prior ultrasound and other imaging  . Depression   . GERD (gastroesophageal reflux disease)   . History of EKG 2016   normal per report  . Hyperlipidemia   . Hypertension 2004  . Migraine   . Wart 2014   finger  . Wears contact lenses    Current Outpatient Medications on File Prior to Visit  Medication Sig Dispense Refill  . buPROPion (WELLBUTRIN SR) 150 MG 12 hr tablet TAKE 1 TABLET BY MOUTH TWICE A DAY 180 tablet 0  . esomeprazole (NEXIUM) 20 MG capsule Take 20 mg by mouth daily at 12 noon.    Marland Kitchen lisinopril (PRINIVIL,ZESTRIL) 5 MG tablet Take 1 tablet (5 mg total) by mouth daily. 90 tablet 3  . Multiple Vitamin (MULTIVITAMIN) tablet Take 1 tablet by mouth daily.    . simvastatin (ZOCOR) 20 MG tablet Take 1 tablet (20 mg total) by mouth daily. 90 tablet 3  . VASCEPA 1 g CAPS TAKE 2  CAPSULES TWICE A DAY 360 capsule 4   No current facility-administered medications on file prior to visit.      Objective: Ht 6' (1.829 m)   Wt 180 lb (81.6 kg)   BMI 24.41 kg/m   Gen: wd, wn, nad Oral cavity appears to be somewhat dry, tongue appears to be somewhat dry, there is some erythema in the pharynx, but I would not classify the tongue as beefy red or no specific geographic tongue or lesions noted of the tongue, no other lesions noted, no obvious thrush Skin: There appears to be a faint macular rash along the lateral torso bilaterally and somewhat on the anterior torso, similar rash on the right upper thigh, few faint similar rash on the palms, the lesions  are scattered and fairly faint   Assessment: Encounter Diagnoses  Name Primary?  . Tongue inflammation Yes  . Rash      Plan: We discussed his tongue symptoms as well as the rash which may or may not be related  At this point I suspect possible food insensitivity/food allergy versus need for more hydration in general with water.  He has been specifically drinking red wine and eating tomatoes recenlty which could have been a trigger.  I advised to increase water intake, for now limit or avoid wine, tomato-based foods, acidic foods, greasy and fried foods, spicy foods.  Keep foods bland for now.  Begin hydroxyzine twice daily for the next 5 to 7 days.  Use mild soap and water for hygiene.  We discussed that the possible etiologies which could include vitamin deficiency, oral thrush, infection otherwise, other causes.  If symptoms do not improve we will consider labs and other evaluation.  He will call back in 5 days by the end of the week to give me an update on symptoms  Of note he was treated for syphilis successfully 2019.    [facility] was seen today for sore throat and rash.  Diagnoses and all orders for this visit:  Tongue inflammation  Rash  Other orders -     hydrOXYzine (ATARAX/VISTARIL) 10 MG tablet; Take 1 tablet (10 mg total) by mouth 2 (two) times daily.

## 2019-04-27 ENCOUNTER — Telehealth: Payer: Self-pay | Admitting: Medical

## 2019-04-27 NOTE — Telephone Encounter (Signed)
Patient sent updated pictures of rash today through my chart. I called patient and we spoke about his symptoms.  Refer back to virtual consult earlier in the week. Based on symptoms 1 week of sore tongue, scratchy throat, rash that appears faint flat macular or possibly even slightly purpuric involving legs soles and palms and torso, possibly sores on the tongue, at this point I feel like his symptoms suggest more of a viral syndrome  We discussed differential which could include coxsackievirus, hand-foot-and-mouth disease, Covid virus, Kawasaki disease, syphilis. He was treated for syphilis successfully within the past 2 years.  Given no other Covid symptoms such as respiratory symptoms of cough, shortness of breath, fever, given no skin peeling, eye involvement, chest pain or shortness of breath or more severe symptoms, I suspect this could be more of a coxsackievirus situation or possibly even Covid with out the respiratory typical symptoms  In the last few days he continues to deny fever, no sick contacts, no tick bites, no body aches or chills, no shortness of breath, no chest pain. Overall he feels improvements over the course of this week.  I advised he call back in 3 days on Monday to give me a 1 more symptom update. If worse over the weekend particular with chest pain, shortness of breath, red eye irritated eye, skin peeling, or feeling much worse in general , then go to urgent care or emergency department.  If not any different by Monday then plan to do CBC with differential, BMP, CMET lab.    He voiced understanding and agreement with plan

## 2019-04-30 ENCOUNTER — Other Ambulatory Visit: Payer: Self-pay | Admitting: Medical

## 2019-05-14 ENCOUNTER — Encounter: Payer: Self-pay | Admitting: Medical

## 2019-05-14 ENCOUNTER — Ambulatory Visit: Payer: BC Managed Care – PPO | Admitting: Medical

## 2019-05-14 ENCOUNTER — Other Ambulatory Visit: Payer: Self-pay

## 2019-05-14 VITALS — BP 130/84 | HR 103 | Temp 97.7°F | Ht 72.0 in | Wt 182.6 lb

## 2019-05-14 DIAGNOSIS — Z0000 Encounter for general adult medical examination without abnormal findings: Secondary | ICD-10-CM

## 2019-05-14 DIAGNOSIS — Z905 Acquired absence of kidney: Secondary | ICD-10-CM

## 2019-05-14 DIAGNOSIS — B349 Viral infection, unspecified: Secondary | ICD-10-CM | POA: Insufficient documentation

## 2019-05-14 DIAGNOSIS — E785 Hyperlipidemia, unspecified: Secondary | ICD-10-CM | POA: Diagnosis not present

## 2019-05-14 DIAGNOSIS — Z808 Family history of malignant neoplasm of other organs or systems: Secondary | ICD-10-CM

## 2019-05-14 DIAGNOSIS — I10 Essential (primary) hypertension: Secondary | ICD-10-CM | POA: Diagnosis not present

## 2019-05-14 DIAGNOSIS — Z125 Encounter for screening for malignant neoplasm of prostate: Secondary | ICD-10-CM

## 2019-05-14 DIAGNOSIS — R21 Rash and other nonspecific skin eruption: Secondary | ICD-10-CM | POA: Insufficient documentation

## 2019-05-14 DIAGNOSIS — B351 Tinea unguium: Secondary | ICD-10-CM | POA: Insufficient documentation

## 2019-05-14 DIAGNOSIS — Z1211 Encounter for screening for malignant neoplasm of colon: Secondary | ICD-10-CM

## 2019-05-14 DIAGNOSIS — K219 Gastro-esophageal reflux disease without esophagitis: Secondary | ICD-10-CM

## 2019-05-14 DIAGNOSIS — Z7189 Other specified counseling: Secondary | ICD-10-CM | POA: Diagnosis not present

## 2019-05-14 DIAGNOSIS — Z7185 Encounter for immunization safety counseling: Secondary | ICD-10-CM

## 2019-05-14 DIAGNOSIS — Z113 Encounter for screening for infections with a predominantly sexual mode of transmission: Secondary | ICD-10-CM | POA: Diagnosis not present

## 2019-05-14 DIAGNOSIS — F324 Major depressive disorder, single episode, in partial remission: Secondary | ICD-10-CM | POA: Insufficient documentation

## 2019-05-14 DIAGNOSIS — R000 Tachycardia, unspecified: Secondary | ICD-10-CM | POA: Insufficient documentation

## 2019-05-14 HISTORY — DX: Major depressive disorder, single episode, in partial remission: F32.4

## 2019-05-14 NOTE — Progress Notes (Signed)
Subjective:   HPI  Chase Rogers is a 48 y.o. male who presents for Chief Complaint  Patient presents with  . Annual Exam    with fasting labs     Medical care team includes: Lequisha Cammack, Camelia Eng, PA-C here for primary care Dentist Eye doctor  Concerns: Exercise - been exercising some, but less last 3 weeks as covid cases started climbing.  Eating habits - healthy.  Eat out a lot less.   Has some yellowish toenails on left foot.   Worried about fungus and treatment options.  Here to f/u on recent virtual visit for tongue irritation, rash, likely viral syndrome.   Still has some fading rash but overall improved.   No other complaint  Reviewed their medical, surgical, family, social, medication, and allergy history and updated chart as appropriate.  Past Medical History:  Diagnosis Date  . Allergy   . Congenital single kidney    per prior ultrasound and other imaging  . Depression   . GERD (gastroesophageal reflux disease)   . History of EKG 2016   normal per report  . Hyperlipidemia   . Hypertension 2004  . Migraine   . Wart 2014   finger  . Wears contact lenses     Past Surgical History:  Procedure Laterality Date  . COLONOSCOPY  2012   abdominal pain, brother hx/o Crohns.  normal per patient  . KNEE SURGERY  2007   right meniscal tear  . MINOR FULGERATION OF ANAL CONDYLOMA  2013    Social History   Socioeconomic History  . Marital status: Married    Spouse name: Not on file  . Number of children: Not on file  . Years of education: Not on file  . Highest education level: Not on file  Occupational History  . Not on file  Tobacco Use  . Smoking status: Heavy Tobacco Smoker    Packs/day: 0.50    Types: Cigarettes  . Smokeless tobacco: Never Used  Substance and Sexual Activity  . Alcohol use: Yes    Alcohol/week: 6.0 standard drinks    Types: 2 Glasses of wine, 2 Cans of beer, 2 Shots of liquor per week  . Drug use: No  . Sexual activity: Yes   Other Topics Concern  . Not on file  Social History Narrative   Lives with husband.  Exercise -[employer] regularly.   Eats relatively healthy.   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 Difficulty of Paying Living Expenses: Not on file  Food Insecurity:   . Worried About Charity fundraiser in the Last Year: Not on file  . Ran Out of Food in the Last Year: Not on file  Transportation Needs:   . Lack of Transportation (Medical): Not on file  . Lack of Transportation (Non-Medical): Not on file  Physical Activity:   . Days of Exercise per Week: Not on file  . Minutes of Exercise per Session: Not on file  Stress:   . Feeling of Stress : Not on file  Social Connections:   . Frequency of Communication with Friends and Family: Not on file  . Frequency of Social Gatherings with Friends and Family: Not on file  . Attends Religious Services: Not on file  . Active Member of Clubs or Organizations: Not on file  . Attends Archivist Meetings: Not on file  . Marital Status: Not on file  Intimate Partner Violence:   . Fear of Current or Ex-Partner: Not on file  . Emotionally Abused: Not on file  . Physically Abused: Not on file  . Sexually Abused: Not on file    Family History  Problem Relation Age of Onset  . Arthritis Mother   . Hyperlipidemia Mother   . Hypertension Mother   . Cancer Mother        melanoma  . Arthritis Father        TKR  . Crohn's disease Brother   . Cancer Maternal Grandmother        breast, stomach  . Heart disease Maternal Grandfather        MI  . Cancer Paternal Grandmother        colon  . Heart disease Paternal Uncle        multiple MIs     Current Outpatient Medications:  .  buPROPion (WELLBUTRIN SR) 150 MG 12 hr tablet, TAKE 1 TABLET BY MOUTH TWICE A DAY, Disp: 180 tablet, Rfl: 0 .  esomeprazole (NEXIUM) 20 MG capsule, Take 20 mg by mouth daily at 12 noon.,  Disp: , Rfl:  .  hydrOXYzine (ATARAX/VISTARIL) 10 MG tablet, Take 1 tablet (10 mg total) by mouth 2 (two) times daily., Disp: 60 tablet, Rfl: 0 .  lisinopril (PRINIVIL,ZESTRIL) 5 MG tablet, Take 1 tablet (5 mg total) by mouth daily., Disp: 90 tablet, Rfl: 3 .  Multiple Vitamin (MULTIVITAMIN) tablet, Take 1 tablet by mouth daily., Disp: , Rfl:  .  simvastatin (ZOCOR) 20 MG tablet, Take 1 tablet (20 mg total) by mouth daily., Disp: 90 tablet, Rfl: 3 .  VASCEPA 1 g CAPS, TAKE 2 CAPSULES TWICE A DAY, Disp: 360 capsule, Rfl: 4  Allergies  Allergen Reactions  . Erythromycin     Age 28, unsure of allergy     Review of Systems Constitutional: -fever, -chills, -sweats, -unexpected weight change, -decreased appetite, -fatigue Allergy: -sneezing, -itching, -congestion Dermatology: -changing moles, +rash, -lumps ENT: -runny nose, -ear pain, -sore throat, -hoarseness, -sinus pain, -teeth pain, - ringing in ears, -hearing loss, -nosebleeds Cardiology: -chest pain, -palpitations, -swelling, -difficulty breathing when lying flat, -waking up short of breath Respiratory: -cough, -shortness of breath, -difficulty breathing with exercise or exertion, -wheezing, -coughing up blood Gastroenterology: -abdominal pain, -nausea, -vomiting, -diarrhea, -constipation, -blood in stool, -changes in bowel movement, -difficulty swallowing or eating Hematology: -bleeding, -bruising  Musculoskeletal: -joint aches, -muscle aches, -joint swelling, -back pain, -neck pain, -cramping, -changes in gait Ophthalmology: denies vision changes, eye redness, itching, discharge Urology: -burning with urination, -difficulty urinating, -blood in urine, -urinary frequency, -urgency, -incontinence Neurology: -headache, -weakness, -tingling, -numbness, -memory loss, -falls, -dizziness Psychology: -depressed mood, -agitation, -sleep problems Male GU: no testicular mass, pain, no lymph nodes swollen, no swelling, no rash.     Objective:   BP 130/84   Pulse (!) 103   Temp 97.7 F (36.5 C)   Ht 6' (1.829 m)   Wt 182 lb 9.6 oz (82.8 kg)   SpO2 99%   BMI 24.77 kg/m   General appearance: alert, no distress, WD/WN, Caucasian male Skin: left scrotum with several small 2-3 mm diameter red brown macules unchanged for years, several scattered macules, no worrisome lesions, left upper back with tattoo of a little red devil, right inguinal region with tattoo of a red scorpion, there is a faint reticular flat pink rash of both legs, more prominent upper thighs bilat. HEENT: normocephalic, conjunctiva/corneas normal, sclerae anicteric, PERRLA, EOMi, nares patent, no discharge  or erythema, pharynx normal Oral cavity: just posterior to and superior oral cavity is a 1cm diameter area of erythema and irritation, otherwise MMM, tongue normal, teeth in good repair Neck: supple, no lymphadenopathy, no thyromegaly, no masses, normal ROM, no bruits Chest: non tender, normal shape and expansion Heart: RRR, normal S1, S2, no murmurs Lungs: CTA bilaterally, no wheezes, rhonchi, or rales Abdomen: +bs, soft, non tender, non distended, no masses, no hepatomegaly, no splenomegaly, no bruits Back: non tender, normal ROM, no scoliosis Musculoskeletal: upper extremities non tender, no obvious deformity, normal ROM throughout, lower extremities non tender, no obvious deformity, normal ROM throughout Extremities: no edema, no cyanosis, no clubbing Pulses: 2+ symmetric, upper and lower extremities, normal cap refill Neurological: alert, oriented x 3, CN2-12 intact, strength normal upper extremities and lower extremities, sensation normal throughout, DTRs 2+ throughout, no cerebellar signs, gait normal Psychiatric: normal affect, behavior normal, pleasant  GU: normal male external genitalia,circumcised, nontender, no masses, no hernia, no lymphadenopathy Rectal: anus normal tone, prostate WNL, no nodules   Assessment and Plan :   Encounter Diagnoses   Name Primary?  . Encounter for health maintenance examination in adult Yes  . Essential hypertension, benign   . Hyperlipidemia, unspecified hyperlipidemia type   . Vaccine counseling   . Family history of melanoma   . Single kidney   . Gastroesophageal reflux disease without esophagitis   . Screening for prostate cancer   . Screen for colon cancer   . Screen for STD (sexually transmitted disease)   . Viral illness   . Rash   . Onychomycosis   . Elevated pulse rate   . Depression, major, single episode, in partial remission [facility])     Physical exam - discussed and counseled on healthy lifestyle, diet, exercise, preventative care, vaccinations, sick and well care, proper use of emergency dept and after hours care, and addressed their concerns.    Health screening: See your eye doctor yearly for routine vision care. See your dentist yearly for routine dental care including hygiene visits twice yearly.  Discussed STD testing, discussed prevention, condom use, means of transmission  Cancer screening: I recommend colon cancer screening at this age if your insurance will cover this, such as colonoscopy or Cologuard stool test  Prostate cancer testing-this includes rectal exam and PSA blood test.  Your last test was normal in 2017  Check your skin regularly for any new lumps or moles or changing lesions.    Vaccines: You are up-to-date on tetanus diphtheria and pertussis vaccine the Tdap vaccine  You are up-to-date on flu vaccine  You are up-to-date on hepatitis A and hepatitis B vaccine  Consider the Covid vaccine when it becomes available   Separate significant chronic issues discussed: Depression - c/t current medication  Hypertension, solitary kidney, continue lisinopril, but avoid in acute dehydration as discussed.  Avoid NSAIDs.  GERD- he uses over-the-counter Nexium as needed, avoid GERD triggers  Onychomycosis - discussed oral and topical lesions, consider  Steave Latronica was seen today for annual exam.  Diagnoses and all orders for this visit:  Encounter for health maintenance examination in adult -     CBC with Differential -     Cancel: PSA -     Lipid Panel -     SED RATE regular -     Renal Function Panel -     Hepatic Function Panel -     PSA -     HIV regular -  GC/Chlamydia Probe Amp -     RPR regular -     TSH regular  Essential hypertension, benign  Hyperlipidemia, unspecified hyperlipidemia type -     Lipid Panel  Vaccine counseling  Family history of melanoma  Single kidney  Gastroesophageal reflux disease without esophagitis  Screening for prostate cancer  Screen for colon cancer  Screen for STD (sexually transmitted disease) -     HIV regular -     GC/Chlamydia Probe Amp -     RPR regular  Viral illness -     SED RATE regular  Rash -     SED RATE regular  Onychomycosis  Elevated pulse rate -     TSH regular  Depression, major, single episode, in partial remission ([place])   Follow-up pending labs, yearly for physical

## 2019-05-14 NOTE — Patient Instructions (Addendum)
Thanks for trusting [place] with your health care and for coming in for a physical today.  Below are some general recommendations I have for you:  Yearly screenings See your eye doctor yearly for routine vision care. See your dentist yearly for routine dental care including hygiene visits twice yearly. See me here yearly for a routine physical and preventative care visit   Specific Concerns today:  Cancer screening: I recommend colon cancer screening at this age if your insurance will cover this, such as colonoscopy or Cologuard stool test  Prostate cancer testing-this includes rectal exam and PSA blood test.  Your last test was normal in 2017  Check your skin regularly for any new lumps or moles or changing lesions.    Vaccines: You are up-to-date on tetanus diphtheria and pertussis vaccine the Tdap vaccine  You are up-to-date on flu vaccine  You are up-to-date on hepatitis A and hepatitis B vaccine  Consider the Covid vaccine when it becomes available   Please follow up yearly for a physical.   Preventative Care for Adults - Male      [place]:  A routine yearly physical is a good way to check in with your primary care provider about your health and preventive screening. It is also an opportunity to share updates about your health and any concerns you have, and receive a thorough all-over exam.   Most health insurance companies pay for at least some preventative services.  Check with your health plan for specific coverages.  WHAT PREVENTATIVE SERVICES DO MEN NEED?  Adult men should have their weight and blood pressure checked regularly.   Men age 2 and older should have their cholesterol levels checked regularly.  Beginning at age 25 and continuing to age 32, men should be screened for colorectal cancer.  Certain people may need continued testing until age 69.  Updating vaccinations is part of preventative care.  Vaccinations help protect against  diseases such as the flu.  Osteoporosis is a disease in which the bones lose minerals and strength as we age. Men ages 65 and over should discuss this with their caregivers  Lab tests are generally done as part of preventative care to screen for anemia and blood disorders, to screen for problems with the kidneys and liver, to screen for bladder problems, to check blood sugar, and to check your cholesterol level.  Preventative services generally include counseling about diet, exercise, avoiding tobacco, drugs, excessive alcohol consumption, and sexually transmitted infections.    GENERAL RECOMMENDATIONS FOR GOOD HEALTH:  Healthy diet:  Eat a variety of foods, including fruit, vegetables, animal or vegetable protein, such as meat, fish, chicken, and eggs, or beans, lentils, tofu, and grains, such as rice.  Drink plenty of water daily.  Decrease saturated fat in the diet, avoid lots of red meat, processed foods, sweets, fast foods, and fried foods.  Exercise:  Aerobic exercise helps maintain good heart health. At least 30-40 minutes of moderate-intensity exercise is recommended. For example, a brisk walk that increases your heart rate and breathing. This should be done on most days of the week.   Find a type of exercise or a variety of exercises that you enjoy so that it becomes a part of your daily life.  Examples are running, walking, swimming, water aerobics, and biking.  For motivation and support, explore group exercise such as aerobic class, spin class, Zumba, Yoga,or  martial arts, etc.    Set exercise goals for yourself, such as  a certain weight goal, walk or run in a race such as a 5k walk/run.  Speak to your primary care provider about exercise goals.  Disease prevention:  If you smoke or chew tobacco, find out from your caregiver how to quit. It can literally save your life, no matter how long you have been a tobacco user. If you do not use tobacco, never begin.   Maintain a  healthy diet and normal weight. Increased weight leads to problems with blood pressure and diabetes.   The Body Mass Index or BMI is a way of measuring how much of your body is fat. Having a BMI above 27 increases the risk of heart disease, diabetes, hypertension, stroke and other problems related to obesity. Your caregiver can help determine your BMI and based on it develop an exercise and dietary program to help you achieve or maintain this important measurement at a healthful level.  High blood pressure causes heart and blood vessel problems.  Persistent high blood pressure should be treated with medicine if weight loss and exercise do not work.   Fat and cholesterol leaves deposits in your arteries that can block them. This causes heart disease and vessel disease elsewhere in your body.  If your cholesterol is found to be high, or if you have heart disease or certain other medical conditions, then you may need to have your cholesterol monitored frequently and be treated with medication.   Ask if you should have a cardiac stress test if your history suggests this. A stress test is a test done on a treadmill that looks for heart disease. This test can find disease prior to there being a problem.  Osteoporosis is a disease in which the bones lose minerals and strength as we age. This can result in serious bone fractures. Risk of osteoporosis can be identified using a bone density scan. Men ages 99 and over should discuss this with their caregivers. Ask your caregiver whether you should be taking a calcium supplement and Vitamin D, to reduce the rate of osteoporosis.   Avoid drinking alcohol in excess (more than two drinks per day).  Avoid use of street drugs. Do not share needles with anyone. Ask for professional help if you need assistance or instructions on stopping the use of alcohol, cigarettes, and/or drugs.  Brush your teeth twice a day with fluoride toothpaste, and floss once a day. Good oral  hygiene prevents tooth decay and gum disease. The problems can be painful, unattractive, and can cause other health problems. Visit your dentist for a routine oral and dental check up and preventive care every 6-12 months.   Look at your skin regularly.  Use a mirror to look at your back. Notify your caregivers of changes in moles, especially if there are changes in shapes, colors, a size larger than a pencil eraser, an irregular border, or development of new moles.  Safety:  Use seatbelts 100% of the time, whether driving or as a passenger.  Use safety devices such as hearing protection if you work in environments with loud noise or significant background noise.  Use safety glasses when doing any work that could send debris in to the eyes.  Use a helmet if you ride a bike or motorcycle.  Use appropriate safety gear for contact sports.  Talk to your caregiver about gun safety.  Use sunscreen with a SPF (or skin protection factor) of 15 or greater.  Lighter skinned people are at a greater risk of skin cancer.  Don't forget to also wear sunglasses in order to protect your eyes from too much damaging sunlight. Damaging sunlight can accelerate cataract formation.   Practice safe sex. Use condoms. Condoms are used for birth control and to help reduce the spread of sexually transmitted infections (or STIs).  Some of the STIs are gonorrhea (the clap), chlamydia, syphilis, trichomonas, herpes, HPV (human papilloma virus) and HIV (human immunodeficiency virus) which causes AIDS. The herpes, HIV and HPV are viral illnesses that have no cure. These can result in disability, cancer and death.   Keep carbon monoxide and smoke detectors in your home functioning at all times. Change the batteries every 6 months or use a model that plugs into the wall.   Vaccinations:  Stay up to date with your tetanus shots and other required immunizations. You should have a booster for tetanus every 10 years. Be sure to get your  flu shot every year, since 5%-20% of the [place] population comes down with the flu. The flu vaccine changes each year, so being vaccinated once is not enough. Get your shot in the fall, before the flu season peaks.   Other vaccines to consider:  Human Papilloma Virus or HPV causes cancer of the cervix, and other infections that can be transmitted from person to person. There is a vaccine for HPV, and males should get immunized between the ages of 72 and 52. It requires a series of 3 shots.   Pneumococcal vaccine to protect against certain types of pneumonia.  This is normally recommended for adults age 75 or older.  However, adults younger than 48 years old with certain underlying conditions such as diabetes, heart or lung disease should also receive the vaccine.  Shingles vaccine to protect against Varicella Zoster if you are older than age 64, or younger than 48 years old with certain underlying illness.  If you have not had the Shingrix vaccine, please call your insurer to inquire about coverage for the Shingrix vaccine given in 2 doses.   Some insurers cover this vaccine after age 65, some cover this after age 48.  If your insurer covers this, then call to schedule appointment to have this vaccine here  Hepatitis A vaccine to protect against a form of infection of the liver by a virus acquired from food.  Hepatitis B vaccine to protect against a form of infection of the liver by a virus acquired from blood or body fluids, particularly if you work in health care.  If you plan to travel internationally, check with your local health department for specific vaccination recommendations.   What should I know about cancer screening? Many types of cancers can be detected early and may often be prevented. Lung Cancer  You should be screened every year for lung cancer if: ? You are a current smoker who has smoked for at least 30 years. ? You are a former smoker who has quit within the past 15  years.  Talk to your health care provider about your screening options, when you should start screening, and how often you should be screened.  Colorectal Cancer  Routine colorectal cancer screening usually begins at 48 years of age and should be repeated every 5-10 years until you are 48 years old. You may need to be screened more often if early forms of precancerous polyps or small growths are found. Your health care provider may recommend screening at an earlier age if you have risk factors for colon cancer.  Your health care provider  may recommend using home test kits to check for hidden blood in the stool.  A small camera at the end of a tube can be used to examine your colon (sigmoidoscopy or colonoscopy). This checks for the earliest forms of colorectal cancer.  Prostate and Testicular Cancer  Depending on your age and overall health, your health care provider may do certain tests to screen for prostate and testicular cancer.  Talk to your health care provider about any symptoms or concerns you have about testicular or prostate cancer.  Skin Cancer  Check your skin from head to toe regularly.  Tell your health care provider about any new moles or changes in moles, especially if: ? There is a change in a mole's size, shape, or color. ? You have a mole that is larger than a pencil eraser.  Always use sunscreen. Apply sunscreen liberally and repeat throughout the day.  Protect yourself by wearing long sleeves, pants, a wide-brimmed hat, and sunglasses when outside.

## 2019-05-15 LAB — RENAL FUNCTION PANEL
Albumin: 4.7 g/dL (ref 4.0–5.0)
BUN/Creatinine Ratio: 10 (ref 9–20)
BUN: 11 mg/dL (ref 6–24)
CO2: 21 mmol/L (ref 20–29)
Calcium: 10.1 mg/dL (ref 8.7–10.2)
Chloride: 98 mmol/L (ref 96–106)
Creatinine, Ser: 1.13 mg/dL (ref 0.76–1.27)
GFR calc Af Amer: 88 mL/min/{1.73_m2} (ref >59)
GFR calc non Af Amer: 76 mL/min/{1.73_m2} (ref >59)
Glucose: 108 mg/dL — ABNORMAL HIGH (ref 65–99)
Phosphorus: 2.9 mg/dL (ref 2.8–4.1)
Potassium: 4.6 mmol/L (ref 3.5–5.2)
Sodium: 136 mmol/L (ref 134–144)

## 2019-05-15 LAB — HEPATIC FUNCTION PANEL
ALT: 28 IU/L (ref 0–44)
AST: 26 IU/L (ref 0–40)
Alkaline Phosphatase: 104 IU/L (ref 39–117)
Bilirubin Total: 0.4 mg/dL (ref 0.0–1.2)
Bilirubin, Direct: 0.1 mg/dL (ref 0.00–0.40)
Total Protein: 7.7 g/dL (ref 6.0–8.5)

## 2019-05-15 LAB — RPR, QUANT+TP ABS (REFLEX)
Rapid Plasma Reagin, Quant: 1:128 {titer} — ABNORMAL HIGH
T Pallidum Abs: REACTIVE — AB

## 2019-05-15 LAB — CBC WITH DIFFERENTIAL/PLATELET
Basophils Absolute: 0 10*3/uL (ref 0.0–0.2)
Basos: 1 %
EOS (ABSOLUTE): 0.1 10*3/uL (ref 0.0–0.4)
Eos: 1 %
Hematocrit: 47.2 % (ref 37.5–51.0)
Hemoglobin: 16 g/dL (ref 13.0–17.7)
Immature Grans (Abs): 0 10*3/uL (ref 0.0–0.1)
Immature Granulocytes: 1 %
Lymphocytes Absolute: 1.3 10*3/uL (ref 0.7–3.1)
Lymphs: 20 %
MCH: 29 pg (ref 26.6–33.0)
MCHC: 33.9 g/dL (ref 31.5–35.7)
MCV: 86 fL (ref 79–97)
Monocytes Absolute: 0.6 10*3/uL (ref 0.1–0.9)
Monocytes: 10 %
Neutrophils Absolute: 4.3 10*3/uL (ref 1.4–7.0)
Neutrophils: 67 %
Platelets: 411 10*3/uL (ref 150–450)
RBC: 5.52 x10E6/uL (ref 4.14–5.80)
RDW: 12.2 % (ref 11.6–15.4)
WBC: 6.3 10*3/uL (ref 3.4–10.8)

## 2019-05-15 LAB — LIPID PANEL
Chol/HDL Ratio: 4.1 ratio (ref 0.0–5.0)
Cholesterol, Total: 157 mg/dL (ref 100–199)
HDL: 38 mg/dL — ABNORMAL LOW (ref >39)
LDL Chol Calc (NIH): 89 mg/dL (ref 0–99)
Triglycerides: 175 mg/dL — ABNORMAL HIGH (ref 0–149)
VLDL Cholesterol Cal: 30 mg/dL (ref 5–40)

## 2019-05-15 LAB — GC/CHLAMYDIA PROBE AMP
Chlamydia trachomatis, NAA: NEGATIVE
Neisseria Gonorrhoeae by PCR: NEGATIVE

## 2019-05-15 LAB — HIV ANTIBODY (ROUTINE TESTING W REFLEX): HIV Screen 4th Generation wRfx: NONREACTIVE

## 2019-05-15 LAB — TSH: TSH: 1.1 u[IU]/mL (ref 0.450–4.500)

## 2019-05-15 LAB — PSA: Prostate Specific Ag, Serum: 0.3 ng/mL (ref 0.0–4.0)

## 2019-05-15 LAB — SEDIMENTATION RATE: Sed Rate: 43 mm/hr — ABNORMAL HIGH (ref 0–15)

## 2019-05-15 LAB — RPR: RPR Ser Ql: REACTIVE — AB

## 2019-05-16 ENCOUNTER — Other Ambulatory Visit (INDEPENDENT_AMBULATORY_CARE_PROVIDER_SITE_OTHER): Payer: BC Managed Care – PPO

## 2019-05-16 ENCOUNTER — Other Ambulatory Visit: Payer: Self-pay

## 2019-05-16 ENCOUNTER — Telehealth: Payer: Self-pay | Admitting: Medical

## 2019-05-16 DIAGNOSIS — R21 Rash and other nonspecific skin eruption: Secondary | ICD-10-CM | POA: Diagnosis not present

## 2019-05-16 MED ORDER — PENICILLIN G BENZATHINE 2400000 UNIT/4ML IM SUSP
2.4000 10*6.[IU] | Freq: Once | INTRAMUSCULAR | Status: AC
  Administered 2019-05-16: 16:00:00 2400000 [IU] via INTRAMUSCULAR

## 2019-05-16 NOTE — Telephone Encounter (Signed)
He will need Penicillin G benzathine 2.4 million units IM when he comes in today.

## 2019-05-16 NOTE — Telephone Encounter (Signed)
Patient is in office

## 2019-05-16 NOTE — Telephone Encounter (Signed)
Patient will be coming today around 3:45 pm for injection.

## 2019-05-17 NOTE — Progress Notes (Signed)
Health Department is already aware. Spoke to them on yesterday

## 2019-05-18 ENCOUNTER — Other Ambulatory Visit: Payer: Self-pay | Admitting: Medical

## 2019-07-11 ENCOUNTER — Other Ambulatory Visit: Payer: Self-pay | Admitting: Medical

## 2019-07-27 ENCOUNTER — Other Ambulatory Visit: Payer: Self-pay | Admitting: Medical

## 2019-07-30 ENCOUNTER — Other Ambulatory Visit: Payer: Self-pay | Admitting: Medical

## 2019-08-02 ENCOUNTER — Other Ambulatory Visit: Payer: Self-pay

## 2019-08-02 ENCOUNTER — Ambulatory Visit: Payer: BC Managed Care – PPO | Admitting: Medical

## 2019-08-02 ENCOUNTER — Encounter: Payer: Self-pay | Admitting: Medical

## 2019-08-02 VITALS — BP 132/80 | HR 93 | Temp 98.0°F | Ht 72.0 in | Wt 186.2 lb

## 2019-08-02 DIAGNOSIS — Z905 Acquired absence of kidney: Secondary | ICD-10-CM

## 2019-08-02 DIAGNOSIS — M7061 Trochanteric bursitis, right hip: Secondary | ICD-10-CM | POA: Diagnosis not present

## 2019-08-02 DIAGNOSIS — M25551 Pain in right hip: Secondary | ICD-10-CM | POA: Diagnosis not present

## 2019-08-02 DIAGNOSIS — A539 Syphilis, unspecified: Secondary | ICD-10-CM | POA: Diagnosis not present

## 2019-08-02 MED ORDER — HYDROCODONE-ACETAMINOPHEN 5-325 MG PO TABS: 1.0000 | ORAL_TABLET | Freq: Four times a day (QID) | ORAL | 0 refills | Status: DC | PRN

## 2019-08-02 NOTE — Progress Notes (Signed)
Subjective Chief Complaint  Patient presents with  . Hip Pain    right x2 months   Here for 2 month hx/o right hip pain.  No specific injury.   Crossing right leg hurts.  external ROM seems to hurt every time.   Not usually pain with walking.   No knee or ankle pain.  No back pain.  Few months ago on treadmills he ran instead of walking, but few days or some time later had some pain.  No other aggravating or relieving factors. No other complaint.   Objective: BP 132/80   Pulse 93   Temp 98 F (36.7 C)   Ht 6' (1.829 m)   Wt 186 lb 3.2 oz (84.5 kg)   SpO2 97%   BMI 25.25 kg/m   Gen: wd, wn, nad Tender over right trochanteric bursa, tender with external ROM, otherwise nontender, no other swelling or deformity, rest of leg nontender with normal ROM.  Legs neurovascularly intact    Assessment: Encounter Diagnoses  Name Primary?  . Right hip pain Yes  . Trochanteric bursitis of right hip   . Single kidney   . Syphilis      Plan: Discussed findings suggestive of right trochanteric bursitis.   Discussed option for therapy, rest, ice, pain medication, physical therapy, steroid injection.  He consents for steroid injection.  We want to avoid insults to kidney given solitary kidney so we will avoid NSAID orally.   Discussed risk and benefits of procedure.  Patient gives consent.  Cleaned and prepped area in usual sterile fashion.   Injected the right trochanteric bursa with 40 mg of triamcinolone in 5 cc of Lidocaine with a 20-gauge needle (10cc syringe).  Estimated blood loss less than 1 cc, patient tolerated procedure well.  Discussed aftercare.    Discussed rest, ice, can use pain medication below or tylenol prn.  Call if not much improved within the next 10 days  F/u today lab today for recent for + syphilis.  He had penicillin injection about 3 months ago x 1.     Chase Rogers was seen today for hip pain.  Diagnoses and all orders for this visit:  Right hip pain  Trochanteric  bursitis of right hip  Single kidney  Syphilis -     RPR  Other orders -     HYDROcodone-acetaminophen (NORCO) 5-325 MG tablet; Take 1 tablet by mouth every 6 (six) hours as needed.

## 2019-08-02 NOTE — Addendum Note (Signed)
Addended by: Edgar Frisk on: 08/02/2019 02:54 PM   Modules accepted: Orders

## 2019-08-02 NOTE — Patient Instructions (Signed)
We gave a steroid injection for trochanteric bursitis today  You can use rest, and try to avoid stairs and lots of bending the next several days  You can use ice therapy 20 minutes at a time.  You can use the pain medication I prescribed today or over the counter tylenol for pain every 4-6 hours short term    Hip Bursitis  Hip bursitis is swelling of a fluid-filled sac (bursa) in your hip joint. This swelling (inflammation) can be painful. This condition may come and go over time. What are the causes?  Injury to the hip.  Overuse of the muscles that surround the hip joint.  An earlier injury or surgery of the hip.  Arthritis or gout.  Diabetes.  Thyroid disease.  Infection.  In some cases, the cause may not be known. What are the signs or symptoms?  Mild or moderate pain in the hip area. Pain may get worse with movement.  Tenderness and swelling of the hip, especially on the outer side of the hip.  In rare cases, the bursa may become infected. This may cause: ? A fever. ? Warmth and redness in the area. Symptoms may come and go. How is this treated? This condition is treated by resting, icing, applying pressure (compression), and raising (elevating) the injured area. You may hear this called the RICE treatment. Treatment may also include:  Using crutches.  Draining fluid out of the bursa to help relieve swelling.  Giving a shot of (injecting) medicine that helps to reduce swelling (cortisone).  Other medicines if the bursa is infected. Follow these instructions at home: Managing pain, stiffness, and swelling   If told, put ice on the painful area. ? Put ice in a plastic bag. ? Place a towel between your skin and the bag. ? Leave the ice on for 20 minutes, 2-3 times a day. ? Raise (elevate) your hip above the level of your heart as much as you can without pain. To do this, try putting a pillow under your hips while you lie down. Stop if this causes pain.  Activity  Return to your normal activities as told by your doctor. Ask your doctor what activities are safe for you.  Rest and protect your hip as much as you can until you feel better. General instructions  Take over-the-counter and prescription medicines only as told by your doctor.  Wear wraps that put pressure on your hip (compression wraps) only as told by your doctor.  Do not use your hip to support your body weight until your doctor says that you can.  Use crutches as told by your doctor.  Gently rub and stretch your injured area as often as is comfortable.  Keep all follow-up visits as told by your doctor. This is important. How is this prevented?  Exercise regularly, as told by your doctor.  Warm up and stretch before being active.  Cool down and stretch after being active.  Avoid activities that bother your hip or cause pain.  Avoid sitting down for long periods at a time. Contact a doctor if:  You have a fever.  You get new symptoms.  You have trouble walking.  You have trouble doing everyday activities.  You have pain that gets worse.  You have pain that does not get better with medicine.  You get red skin on your hip area.  You get a feeling of warmth in your hip area. Get help right away if:  You cannot move your  hip.  You have very bad pain. Summary  Hip bursitis is swelling of a fluid-filled sac (bursa) in your hip.  Hip bursitis can be painful.  Symptoms often come and go over time.  This condition is treated with rest, ice, compression, elevation, and medicines. This information is not intended to replace advice given to you by your health care provider. Make sure you discuss any questions you have with your health care provider. Document Revised: 01/16/2018 Document Reviewed: 01/16/2018 Elsevier Patient Education  [place].

## 2019-08-03 LAB — RPR, QUANT+TP ABS (REFLEX)
Rapid Plasma Reagin, Quant: 1:64 {titer} — ABNORMAL HIGH
T Pallidum Abs: REACTIVE — AB

## 2019-08-03 LAB — RPR: RPR Ser Ql: REACTIVE — AB

## 2019-09-15 ENCOUNTER — Other Ambulatory Visit: Payer: Self-pay | Admitting: Medical

## 2019-09-17 ENCOUNTER — Other Ambulatory Visit: Payer: Self-pay | Admitting: Medical

## 2019-09-17 MED ORDER — LISINOPRIL 5 MG PO TABS: 5.0000 mg | ORAL_TABLET | Freq: Every day | ORAL | 3 refills | Status: DC

## 2019-10-02 ENCOUNTER — Encounter: Payer: Self-pay | Admitting: Medical

## 2019-10-02 ENCOUNTER — Telehealth: Payer: BC Managed Care – PPO | Admitting: Medical

## 2019-10-02 ENCOUNTER — Other Ambulatory Visit: Payer: Self-pay

## 2019-10-02 VITALS — BP 133/83 | Ht 72.0 in | Wt 177.5 lb

## 2019-10-02 DIAGNOSIS — R05 Cough: Secondary | ICD-10-CM | POA: Diagnosis not present

## 2019-10-02 DIAGNOSIS — J019 Acute sinusitis, unspecified: Secondary | ICD-10-CM | POA: Diagnosis not present

## 2019-10-02 DIAGNOSIS — J988 Other specified respiratory disorders: Secondary | ICD-10-CM

## 2019-10-02 DIAGNOSIS — R059 Cough, unspecified: Secondary | ICD-10-CM

## 2019-10-02 MED ORDER — AMOXICILLIN 875 MG PO TABS: 875.0000 mg | ORAL_TABLET | Freq: Two times a day (BID) | ORAL | 0 refills | Status: DC

## 2019-10-02 NOTE — Progress Notes (Signed)
Subjective:     Patient ID: Chase Rogers, male   DOB: [birth year], 49 y.o.   MRN: [id number]  This visit type was conducted due to national recommendations for restrictions regarding the COVID-19 Pandemic (e.g. social distancing) in an effort to limit this patient's exposure and mitigate transmission in our community.  Due to their co-morbid illnesses, this patient is at least at moderate risk for complications without adequate follow up.  This format is felt to be most appropriate for this patient at this time.    Documentation for virtual audio and video telecommunications through Zoom encounter:  The patient was located at home. The provider was located in the office. The patient did consent to this visit and is aware of possible charges through their insurance for this visit.  The other persons participating in this telemedicine service were none. Time spent on call was 20 minutes and in review of previous records 20 minutes total.  This virtual service is not related to other E/M service within previous 7 days.   HPI Chief Complaint  Patient presents with  . Cough    with nasal drainage    Virtual consult respiratory tract infection.  He got a phone with husband Chase Rogers.  Partner symptoms started 1.5 weeks ago and he is doing better.  Chase Rogers symptoms began 5 days ago with cough, sinus drainage, congestion that feels like it is having in his throat.  Has been using Mucinex.  No fever no chills no body aches no change in taste or smell.  He and partner both had the [employer] vaccine for Covid back in April.  He is trying to get rid of this as they are supposed to be around partners elderly mother this weekend.. No other sick contacts.  Partner got tested for Covid several days ago and it was negative.  Using NyQuil some at night for cough.  No other aggravating or relieving factors. No other complaint.   Past Medical History:  Diagnosis Date  . Allergy   . Congenital single  kidney    per prior ultrasound and other imaging  . Depression   . GERD (gastroesophageal reflux disease)   . History of EKG 2016   normal per report  . Hyperlipidemia   . Hypertension 2004  . Migraine   . Wart 2014   finger  . Wears contact lenses    Current Outpatient Medications on File Prior to Visit  Medication Sig Dispense Refill  . buPROPion (WELLBUTRIN SR) 150 MG 12 hr tablet TAKE 1 TABLET BY MOUTH TWICE A DAY 180 tablet 0  . esomeprazole (NEXIUM) 20 MG capsule Take 20 mg by mouth daily at 12 noon.    Marland Kitchen lisinopril (ZESTRIL) 5 MG tablet Take 1 tablet (5 mg total) by mouth daily. 90 tablet 3  . Multiple Vitamin (MULTIVITAMIN) tablet Take 1 tablet by mouth daily.    . simvastatin (ZOCOR) 20 MG tablet TAKE 1 TABLET BY MOUTH EVERY DAY 90 tablet 0  . VASCEPA 1 g capsule TAKE 2 CAPSULES BY MOUTH TWICE A DAY 360 capsule 1  . HYDROcodone-acetaminophen (NORCO) 5-325 MG tablet Take 1 tablet by mouth every 6 (six) hours as needed. (Patient not taking: Reported on 10/02/2019) 12 tablet 0  . hydrOXYzine (ATARAX/VISTARIL) 10 MG tablet TAKE 1 TABLET BY MOUTH TWICE A DAY (Patient not taking: Reported on 10/02/2019) 180 tablet 1   No current facility-administered medications on file prior to visit.    Review of Systems As in subjective  Objective:   Physical Exam Due to coronavirus pandemic stay at home measures, patient visit was virtual and they were not examined in person.   BP 133/83   Ht 6' (1.829 m)   Wt 177 lb 8 oz (80.5 kg)   BMI 24.07 kg/m       Assessment:     Encounter Diagnoses  Name Primary?  . Acute non-recurrent sinusitis, unspecified location Yes  . Cough   . Respiratory tract infection        Plan:     I advised that I cannot completely rule out Covid.  Partner did get a negative Covid test recently and they have both been vaccinated back in March.  Begin medication below, continue Mucinex, hydrate well, discussed supportive care.  If not much improved  within the next 2 to 3 days or if worsening symptoms call back.  [name] was seen today for cough.  Diagnoses and all orders for this visit:  Acute non-recurrent sinusitis, unspecified location  Cough  Respiratory tract infection  Other orders -     amoxicillin (AMOXIL) 875 MG tablet; Take 1 tablet (875 mg total) by mouth 2 (two) times daily.  f/u prn

## 2019-10-04 ENCOUNTER — Other Ambulatory Visit: Payer: Self-pay | Admitting: Medical

## 2019-10-04 MED ORDER — HYDROCODONE-ACETAMINOPHEN 5-325 MG PO TABS: 1.0000 | ORAL_TABLET | Freq: Four times a day (QID) | ORAL | 0 refills | Status: DC | PRN

## 2019-10-10 ENCOUNTER — Other Ambulatory Visit: Payer: Self-pay | Admitting: Medical

## 2019-10-31 ENCOUNTER — Other Ambulatory Visit: Payer: Self-pay | Admitting: Medical

## 2019-10-31 MED ORDER — PREDNISONE 10 MG PO TABS
ORAL_TABLET | ORAL | 0 refills | Status: DC
Start: 2019-10-31 — End: 2020-01-08

## 2019-12-31 ENCOUNTER — Other Ambulatory Visit: Payer: Self-pay | Admitting: Medical

## 2020-01-04 ENCOUNTER — Other Ambulatory Visit: Payer: Self-pay | Admitting: Medical

## 2020-01-08 ENCOUNTER — Other Ambulatory Visit: Payer: Self-pay | Admitting: Family Medicine

## 2020-01-08 ENCOUNTER — Encounter: Payer: Self-pay | Admitting: Family Medicine

## 2020-01-08 ENCOUNTER — Telehealth: Payer: Self-pay | Admitting: Family Medicine

## 2020-01-08 ENCOUNTER — Ambulatory Visit: Payer: Managed Care, Other (non HMO) | Admitting: Family Medicine

## 2020-01-08 ENCOUNTER — Other Ambulatory Visit: Payer: Self-pay

## 2020-01-08 VITALS — BP 110/70 | HR 94 | Temp 98.4°F | Wt 187.2 lb

## 2020-01-08 DIAGNOSIS — R1032 Left lower quadrant pain: Secondary | ICD-10-CM

## 2020-01-08 DIAGNOSIS — R10814 Left lower quadrant abdominal tenderness: Secondary | ICD-10-CM | POA: Diagnosis not present

## 2020-01-08 LAB — CBC WITH DIFFERENTIAL/PLATELET
Basophils Absolute: 0 10*3/uL (ref 0.0–0.2)
Basos: 0 %
EOS (ABSOLUTE): 0.1 10*3/uL (ref 0.0–0.4)
Eos: 1 %
Hematocrit: 48.6 % (ref 37.5–51.0)
Hemoglobin: 16.6 g/dL (ref 13.0–17.7)
Lymphocytes Absolute: 2.1 10*3/uL (ref 0.7–3.1)
Lymphs: 23 %
MCH: 30.2 pg (ref 26.6–33.0)
MCHC: 34.2 g/dL (ref 31.5–35.7)
MCV: 88 fL (ref 79–97)
Monocytes Absolute: 0.8 10*3/uL (ref 0.1–0.9)
Monocytes: 9 %
Neutrophils Absolute: 6 10*3/uL (ref 1.4–7.0)
Neutrophils: 67 %
Platelets: 389 10*3/uL (ref 150–450)
RBC: 5.5 x10E6/uL (ref 4.14–5.80)
RDW: 14 % (ref 11.6–15.4)
WBC: 9 10*3/uL (ref 3.4–10.8)

## 2020-01-08 LAB — POCT URINALYSIS DIP (PROADVANTAGE DEVICE)
Bilirubin, UA: NEGATIVE
Blood, UA: NEGATIVE
Glucose, UA: NEGATIVE mg/dL
Ketones, POC UA: NEGATIVE mg/dL
Leukocytes, UA: NEGATIVE
Nitrite, UA: NEGATIVE
Protein Ur, POC: NEGATIVE mg/dL
Specific Gravity, Urine: 1.01
Urobilinogen, Ur: NEGATIVE
pH, UA: 6 (ref 5.0–8.0)

## 2020-01-08 LAB — COMPREHENSIVE METABOLIC PANEL
ALT: 25 IU/L (ref 0–44)
AST: 22 IU/L (ref 0–40)
Albumin/Globulin Ratio: 2.2 (ref 1.2–2.2)
Albumin: 5 g/dL (ref 4.0–5.0)
Alkaline Phosphatase: 103 IU/L (ref 48–121)
BUN/Creatinine Ratio: 15 (ref 9–20)
BUN: 14 mg/dL (ref 6–24)
Bilirubin Total: 0.4 mg/dL (ref 0.0–1.2)
CO2: 29 mmol/L (ref 20–29)
Calcium: 10.4 mg/dL — ABNORMAL HIGH (ref 8.7–10.2)
Chloride: 99 mmol/L (ref 96–106)
Creatinine, Ser: 0.93 mg/dL (ref 0.76–1.27)
GFR calc Af Amer: 112 mL/min/{1.73_m2} (ref >59)
GFR calc non Af Amer: 97 mL/min/{1.73_m2} (ref >59)
Globulin, Total: 2.3 g/dL (ref 1.5–4.5)
Glucose: 89 mg/dL (ref 65–99)
Potassium: 4.5 mmol/L (ref 3.5–5.2)
Sodium: 137 mmol/L (ref 134–144)
Total Protein: 7.3 g/dL (ref 6.0–8.5)

## 2020-01-08 LAB — HEMOCCULT GUIAC POC 1CARD (OFFICE): Fecal Occult Blood, POC: NEGATIVE

## 2020-01-08 MED ORDER — AMOXICILLIN-POT CLAVULANATE 875-125 MG PO TABS
1.0000 | ORAL_TABLET | Freq: Two times a day (BID) | ORAL | 0 refills | Status: DC
Start: 2020-01-08 — End: 2020-03-12

## 2020-01-08 NOTE — Telephone Encounter (Signed)
Put referral in. 

## 2020-01-08 NOTE — Addendum Note (Signed)
Addended by: Minette Headland A on: 01/08/2020 11:53 AM   Modules accepted: Orders

## 2020-01-08 NOTE — Progress Notes (Signed)
Subjective:    Patient ID: Chase Rogers, male    DOB: 02-May-1971, 49 y.o.   MRN: [id number]  HPI Chief Complaint  Patient presents with  . sharpe pain on left side    started sunday night and not getting better. sometimes turning certain movements will flare it up   Complains of constant, nonradiating and sharp LLQ pain for the past 2 1/2 days. No known injury.  Mild nausea yesterday but feels fine today except for the pain.  No vomiting or diarrhea.  States riding here the bumps made his pain worse.  His pain is worse when he walks.  Normal bowel movements and they do not aggravate and alleviate the pain.  Has not exercised the past few week. Takes a fiber supplement.   He denies ever having pain like this before.  States he had a colonoscopy approximately 10 years ago in [place] for GI issues but he does not know what the results were.  States he has a brother with Crohn's disease.   Denies fever, chills, dizziness, chest pain, palpitations, shortness of breath,  urinary symptoms, LE edema.     Review of Systems Pertinent positives and negatives in the history of present illness.     Objective:   Physical Exam Exam conducted with a chaperone present.  Constitutional:      General: He is not in acute distress.    Appearance: Normal appearance. He is ill-appearing.  Eyes:     Conjunctiva/sclera: Conjunctivae normal.  Cardiovascular:     Rate and Rhythm: Normal rate and regular rhythm.     Pulses: Normal pulses.     Heart sounds: Normal heart sounds.  Pulmonary:     Effort: Pulmonary effort is normal.     Breath sounds: Normal breath sounds.  Abdominal:     General: Abdomen is flat. Bowel sounds are decreased.     Palpations: Abdomen is soft.     Tenderness: There is abdominal tenderness in the left lower quadrant. There is no right CVA tenderness, left CVA tenderness, guarding or rebound. Negative signs include Murphy's sign, McBurney's sign and psoas sign.         Comments: LLQ with severe TTP and no rebound. No referred pain.   Genitourinary:    Penis: Normal.      Testes: Normal.     Epididymis:     Right: Normal.     Left: Normal.     Prostate: Normal.     Rectum: Normal. Guaiac result negative.  Musculoskeletal:     Cervical back: Normal range of motion and neck supple.  Skin:    General: Skin is warm and dry.     Capillary Refill: Capillary refill takes less than 2 seconds.  Neurological:     General: No focal deficit present.     Mental Status: He is alert and oriented to person, place, and time.    BP 110/70   Pulse 94   Temp 98.4 F (36.9 C)   Wt 187 lb 3.2 oz (84.9 kg)   SpO2 99%   BMI 25.39 kg/m       Assessment & Plan:  LLQ pain - Plan: CBC with Differential/Platelet, Comprehensive metabolic panel, POCT Urinalysis DIP (Proadvantage Device), CT ABDOMEN PELVIS W CONTRAST  Left lower quadrant abdominal tenderness without rebound tenderness - Plan: POCT occult blood stool, CBC with Differential/Platelet, Comprehensive metabolic panel, POCT Urinalysis DIP (Proadvantage Device), CT ABDOMEN PELVIS W CONTRAST  Left lower quadrant abdominal pain - Plan:  CT ABDOMEN PELVIS W CONTRAST  Symptoms are concerning for early diverticulitis.  Constant left lower quadrant pain with severe tenderness.  He also has pain in his LLQ when riding in the car of bumps or when standing on tip toes and lowering down.  Normal UA and genital exam as well as prostate exam and hemoccult negative.  I will get stat CBC, CMP and CT of abdomen and pelvis.

## 2020-01-08 NOTE — Telephone Encounter (Signed)
Called and spoke to patient and explained that his insurance carrier denied coverage for a CT scan of his abdomen and pelvis.  I will start him on Augmentin and a probiotic.  Discussed that if his pain worsens or if he has any new symptoms that he will go to the emergency department for further evaluation.  I will also refer him to GI due to suspected diverticulitis and history of GI issues with colonoscopy approximately 10 years ago in [place].  He is also due for screening colonoscopy per guidelines.

## 2020-01-10 ENCOUNTER — Encounter: Payer: Self-pay | Admitting: Gastroenterology

## 2020-01-11 ENCOUNTER — Telehealth: Payer: Self-pay | Admitting: Medical

## 2020-01-11 NOTE — Telephone Encounter (Signed)
P.A. VASCEPA  °

## 2020-01-14 NOTE — Telephone Encounter (Signed)
P.A. approved, called pharmacy went thru for $105 for 3 months, called pt informed

## 2020-03-06 ENCOUNTER — Other Ambulatory Visit: Payer: Self-pay

## 2020-03-06 MED ORDER — SIMVASTATIN 20 MG PO TABS: 20.0000 mg | ORAL_TABLET | Freq: Every day | ORAL | 0 refills | Status: DC

## 2020-03-12 ENCOUNTER — Ambulatory Visit: Payer: Managed Care, Other (non HMO) | Admitting: Gastroenterology

## 2020-03-12 ENCOUNTER — Encounter: Payer: Self-pay | Admitting: Gastroenterology

## 2020-03-12 VITALS — BP 136/88 | HR 110 | Ht 72.0 in | Wt 190.5 lb

## 2020-03-12 DIAGNOSIS — R1032 Left lower quadrant pain: Secondary | ICD-10-CM

## 2020-03-12 NOTE — Progress Notes (Signed)
Referring Provider: Carlena Hurl, PA-C Primary Care Physician:  Carlena Hurl, PA-C  Reason for Consultation:  Abdominal pain   IMPRESSION:  LLQ pain - unchanged despite empiric treatment with Augmentin GERD Rectal bleeding last year - attributed to hemorrhoids Occasional NSAIDs Brother with Crohns Family history of polyps (mother) Family history of colon cancer (paternal grandmother at age 49) Family history of gastric cancer (maternal grandmother in her 61s) No prior colon cancer screening   PLAN: High fiber diet recommended Avoid NSAIDs CT abd/pelvis with contrast to evaluate for complications of diverticulitis EGD to evaluate reflux occurring despite PPI therapy and abdominal pain Colonoscopy to evaluate abdominal pain  Please see the "Patient Instructions" section for addition details about the plan.  HPI: Chase Rogers is a 49 y.o. male referred by PA Tysinger. This history is obtained through the patient and review of his electronic health record.   Sharp, non-radiating LLQ pain not changed by eating, defecacation. Certain movements will exacerbated the pain. No change with eating or defecaton, or movement.  Mild associated nausea. Pain less severe over the last 2 months.Treated for diverticulitis with Augmentin and a probiotics in August.   Labs 01/08/20 included normal CMP, CBC, stool occult blood Insurance denied coverage for CT scan  Subtle recurrence of the pain over the last couple of weeks.   Bleeding hemorrhoid last year.  Treated with hydrocortisone. Anal pain/cramping occurs minutes to hours after defecation. Some straining.  Baseline bowel habits are 4-10 BM daily. Using Metamucil QAM and fiber supplement QPM.  No significant change in abdominal pain with regular use.   Takes PPI for heartburn. Developed increase in bloating and reflux when he misses several doses.   Excedrin migraine for rare headaches. No other NSAIDs.   Gained 10-15  pounds due to Covid.   Colonoscopy 8-10 years ago in [place], [place] when he was having a different abdominal pain that was ultimately attributed to gas.   Young brother with Crohns diagnosed at age 50. Paternal grandmother with colon cancer at age 63s. Mother with colon polyps.  Maternal grandmother with stomacerh cancer in her 61s. No other known family history of colon cancer or polyps. No family history of uterine/endometrial cancer, pancreatic cancer or gastric/stomach cancer.   Past Medical History:  Diagnosis Date  . Allergy   . Anxiety   . Congenital single kidney    per prior ultrasound and other imaging  . Depression   . GERD (gastroesophageal reflux disease)   . High triglycerides   . History of EKG 2016   normal per report  . Hyperlipidemia   . Hypertension 2004  . Migraine   . Renal agenesis   . Wart 2014   finger  . Wears contact lenses     Past Surgical History:  Procedure Laterality Date  . COLONOSCOPY  2012   abdominal pain, brother hx/o Crohns.  normal per patient  . KNEE SURGERY  2007   right meniscal tear  . MINOR FULGERATION OF ANAL CONDYLOMA  2013  . WISDOM TOOTH EXTRACTION      Current Outpatient Medications  Medication Sig Dispense Refill  . buPROPion (WELLBUTRIN SR) 150 MG 12 hr tablet TAKE 1 TABLET BY MOUTH TWICE A DAY 180 tablet 0  . esomeprazole (NEXIUM) 20 MG capsule Take 20 mg by mouth daily at 12 noon.    Marland Kitchen lisinopril (ZESTRIL) 5 MG tablet Take 1 tablet (5 mg total) by mouth daily. 90 tablet 3  . Multiple Vitamin (MULTIVITAMIN) tablet  Take 1 tablet by mouth daily.    . simvastatin (ZOCOR) 20 MG tablet Take 1 tablet (20 mg total) by mouth daily. 90 tablet 0  . VASCEPA 1 g capsule TAKE 2 CAPSULES BY MOUTH TWICE A DAY 360 capsule 0   No current facility-administered medications for this visit.    Allergies as of 03/12/2020 - Review Complete 03/12/2020  Allergen Reaction Noted  . Erythromycin  05/03/2016    Family History  Problem  Relation Age of Onset  . Arthritis Mother   . Hyperlipidemia Mother   . Hypertension Mother   . Melanoma Mother   . Arthritis Father        TKR  . Hypertension Father   . Crohn's disease Brother   . Stomach cancer Maternal Grandmother   . Breast cancer Maternal Grandmother   . Heart disease Maternal Grandfather   . Heart attack Maternal Grandfather   . Colon cancer Paternal Grandmother   . Heart disease Paternal Uncle   . Heart attack Paternal Uncle   . Heart attack Maternal Uncle   . Esophageal cancer Neg Hx   . Pancreatic cancer Neg Hx   . Liver disease Neg Hx     Social History   Socioeconomic History  . Marital status: Married    Spouse name: Not on file  . Number of children: Not on file  . Years of education: Not on file  . Highest education level: Not on file  Occupational History  . Not on file  Tobacco Use  . Smoking status: Former Smoker    Packs/day: 0.50    Years: 10.00    Pack years: 5.00    Types: Cigarettes    Quit date: 2020    Years since quitting: 1.8  . Smokeless tobacco: Never Used  Vaping Use  . Vaping Use: Never used  Substance and Sexual Activity  . Alcohol use: Yes    Comment: occ  . Drug use: No  . Sexual activity: Yes  Other Topics Concern  . Not on file  Social History Narrative   Lives with husband.  Exercise -[employer] regularly.   Eats relatively healthy.   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 Difficulty of Paying Living Expenses: Not on file  Food Insecurity:   . Worried About Charity fundraiser in the Last Year: Not on file  . Ran Out of Food in the Last Year: Not on file  Transportation Needs:   . Lack of Transportation (Medical): Not on file  . Lack of Transportation (Non-Medical): Not on file  Physical Activity:   . Days of Exercise per Week: Not on file  . Minutes of Exercise per Session: Not on file  Stress:   . Feeling of Stress  : Not on file  Social Connections:   . Frequency of Communication with Friends and Family: Not on file  . Frequency of Social Gatherings with Friends and Family: Not on file  . Attends Religious Services: Not on file  . Active Member of Clubs or Organizations: Not on file  . Attends Archivist Meetings: Not on file  . Marital Status: Not on file  Intimate Partner Violence:   . Fear of Current or Ex-Partner: Not on file  . Emotionally Abused: Not on file  . Physically Abused: Not on file  . Sexually Abused: Not on file    Review of Systems: 85  system ROS is negative except as noted above.   Physical Exam: General:   Alert,  well-nourished, pleasant and cooperative in NAD Head:  Normocephalic and atraumatic. Eyes:  Sclera clear, no icterus.   Conjunctiva pink. Ears:  Normal auditory acuity. Nose:  No deformity, discharge,  or lesions. Mouth:  No deformity or lesions.   Neck:  Supple; no masses or thyromegaly. Lungs:  Clear throughout to auscultation.   No wheezes. Heart:  Regular rate and rhythm; no murmurs. Abdomen:  Soft,nontender, nondistended, normal bowel sounds, no rebound or guarding. No hepatosplenomegaly.   Rectal:  Deferred  Msk:  Symmetrical. No boney deformities LAD: No inguinal or umbilical LAD Extremities:  No clubbing or edema. Neurologic:  Alert and  oriented x4;  grossly nonfocal Skin:  Intact without significant lesions or rashes. Psych:  Alert and cooperative. Normal mood and affect.      Shellee Streng L. Tarri Glenn, MD, MPH 03/12/2020, 2:35 PM

## 2020-03-12 NOTE — Patient Instructions (Addendum)
I have recommended an upper endoscopy and colonoscopy for further evaluation of your pain.  In the meantime, we will schedule a CT scan of the abdomen   If you are age 49 or older, your body mass index should be between 23-30. Your Body mass index is 25.84 kg/m. If this is out of the aforementioned range listed, please consider follow up with your Primary Care Provider.  If you are age 38 or younger, your body mass index should be between 19-25. Your Body mass index is 25.84 kg/m. If this is out of the aformentioned range listed, please consider follow up with your Primary Care Provider.   You have been scheduled for a CT scan of the abdomen and pelvis at [facility] ([address]---this is in the same building as [employer]).   You are scheduled on 03-19-2020 at 2pm. You should arrive 15 minutes prior to your appointment time for registration. Please follow the written instructions below on the day of your exam:  WARNING: IF YOU ARE ALLERGIC TO IODINE/X-RAY DYE, PLEASE NOTIFY RADIOLOGY IMMEDIATELY AT [phone]! YOU WILL BE GIVEN A 13 HOUR PREMEDICATION PREP.  1) Do not eat or drink anything after 10am (4 hours prior to your test) 2) You have been given 2 bottles of oral contrast to drink. The solution may taste better if refrigerated, but do NOT add ice or any other liquid to this solution. Shake well before drinking.    Drink 1 bottle of contrast @ 12/noon (2 hours prior to your exam)  Drink 1 bottle of contrast @ 1pm (1 hour prior to your exam)  You may take any medications as prescribed with a small amount of water, if necessary. If you take any of the following medications: METFORMIN, GLUCOPHAGE, GLUCOVANCE, AVANDAMET, RIOMET, FORTAMET, Trosky MET, JANUMET, GLUMETZA or METAGLIP, you MAY be asked to HOLD this medication 48 hours AFTER the exam.  The purpose of you drinking the oral contrast is to aid in the visualization of your intestinal tract. The  contrast solution may cause some diarrhea. Depending on your individual set of symptoms, you may also receive an intravenous injection of x-ray contrast/dye. Plan on being at [facility] for 30 minutes or longer, depending on the type of exam you are having performed.  This test typically takes 30-45 minutes to complete.  If you have any questions regarding your exam or if you need to reschedule, you may call the CT department at [phone] between the hours of 8:00 am and 5:00 pm, Monday-Friday.  ________________________________________________________________________  Thank you for trusting me with your gastrointestinal care!    Thornton Park, MD, MPH

## 2020-03-13 ENCOUNTER — Other Ambulatory Visit: Payer: Self-pay

## 2020-03-13 DIAGNOSIS — R1032 Left lower quadrant pain: Secondary | ICD-10-CM

## 2020-03-18 ENCOUNTER — Other Ambulatory Visit: Payer: Managed Care, Other (non HMO)

## 2020-03-18 ENCOUNTER — Telehealth: Payer: Self-pay

## 2020-03-18 DIAGNOSIS — R1032 Left lower quadrant pain: Secondary | ICD-10-CM

## 2020-03-18 NOTE — Telephone Encounter (Signed)
-----   Message from Sim Boast sent at 03/18/2020 10:58 AM EDT ----- Regarding: Pls call Lattie Haw at [facility],  Lattie Haw from [facility] CT called asking for Vivien Rota but since she is off today and I see that you are working with Dr. Tarri Glenn, maybe you could call her back. She stated that she has not been able to contact this pt as his phone is not working. He is scheduled for imaging tomorrow but he needs labs and test is not Pre-Cert yet so she needs to know if appt needs to be clx'ed. Her phone is [phone].  Thank you,  Tye Maryland

## 2020-03-18 NOTE — Telephone Encounter (Signed)
I spoke with Shanon Brow and he is here now to get labs done. He is calling his PCP to withdraw the case they have in the system. Then hopefully Amy can get approval.

## 2020-03-19 ENCOUNTER — Inpatient Hospital Stay: Admission: RE | Admit: 2020-03-19 | Payer: Managed Care, Other (non HMO) | Source: Ambulatory Visit

## 2020-03-19 LAB — BUN/CREATININE RATIO
BUN: 15 mg/dL (ref 7–25)
Creat: 1.05 mg/dL (ref 0.60–1.35)
GFR, Est African American: 96 mL/min/{1.73_m2} (ref >=60)
GFR, Est Non African American: 83 mL/min/{1.73_m2} (ref >=60)

## 2020-03-20 NOTE — Telephone Encounter (Signed)
CT has been rescheduled for early November and pre-authorized.

## 2020-03-26 ENCOUNTER — Ambulatory Visit (INDEPENDENT_AMBULATORY_CARE_PROVIDER_SITE_OTHER)
Admission: RE | Admit: 2020-03-26 | Discharge: 2020-03-26 | Disposition: A | Discharge status: Home / Self Care | Payer: Managed Care, Other (non HMO) | Source: Ambulatory Visit | Attending: Gastroenterology | Admitting: Gastroenterology

## 2020-03-26 ENCOUNTER — Other Ambulatory Visit: Payer: Self-pay

## 2020-03-26 DIAGNOSIS — R1032 Left lower quadrant pain: Secondary | ICD-10-CM

## 2020-03-26 MED ORDER — IOHEXOL 300 MG/ML  SOLN
100.0000 mL | Freq: Once | INTRAMUSCULAR | Status: AC | PRN
  Administered 2020-03-26: 100 mL via INTRAVENOUS

## 2020-03-28 ENCOUNTER — Other Ambulatory Visit: Payer: Self-pay

## 2020-04-01 ENCOUNTER — Other Ambulatory Visit: Payer: Self-pay

## 2020-04-01 DIAGNOSIS — K769 Liver disease, unspecified: Secondary | ICD-10-CM

## 2020-04-04 ENCOUNTER — Telehealth: Payer: Self-pay

## 2020-04-04 NOTE — Telephone Encounter (Signed)
-----   Message from Darden Dates sent at 04/04/2020  1:58 PM EST ----- Otilio Connors, Working on [facility] for the MRI for this pt.  Carleene Overlie is saying the hospital site is not medically necessary to do there.  Will need to have him do MRI somewhere else that is a free standing facility. Let me know and I'll plug in new site. Thanks, Amy

## 2020-04-04 NOTE — Telephone Encounter (Signed)
Pts MRI scheduled at [facility] [address] at 4:20pm, pt to arrive there at 3:50pm and be NPO after 12:20pm. Left message for pt to call back.

## 2020-04-04 NOTE — Telephone Encounter (Signed)
MRI is scheduled 04/25/20@4 :20pm. Pt aware.

## 2020-04-09 ENCOUNTER — Ambulatory Visit (HOSPITAL_COMMUNITY): Payer: Managed Care, Other (non HMO)

## 2020-04-12 ENCOUNTER — Other Ambulatory Visit: Payer: Self-pay | Admitting: Medical

## 2020-04-23 ENCOUNTER — Other Ambulatory Visit: Payer: Self-pay | Admitting: Gastroenterology

## 2020-04-25 ENCOUNTER — Ambulatory Visit
Admission: RE | Admit: 2020-04-25 | Discharge: 2020-04-25 | Disposition: A | Discharge status: Home / Self Care | Payer: Managed Care, Other (non HMO) | Source: Ambulatory Visit | Attending: Gastroenterology | Admitting: Gastroenterology

## 2020-04-25 ENCOUNTER — Other Ambulatory Visit: Payer: Self-pay

## 2020-04-25 DIAGNOSIS — K769 Liver disease, unspecified: Secondary | ICD-10-CM

## 2020-04-25 MED ORDER — GADOBENATE DIMEGLUMINE 529 MG/ML IV SOLN
18.0000 mL | Freq: Once | INTRAVENOUS | Status: AC | PRN
  Administered 2020-04-25: 18 mL via INTRAVENOUS

## 2020-05-14 ENCOUNTER — Ambulatory Visit: Payer: Managed Care, Other (non HMO) | Admitting: Medical

## 2020-05-14 ENCOUNTER — Encounter: Payer: Self-pay | Admitting: Medical

## 2020-05-14 ENCOUNTER — Other Ambulatory Visit: Payer: Self-pay

## 2020-05-14 VITALS — BP 122/84 | HR 96 | Ht 72.0 in | Wt 189.8 lb

## 2020-05-14 DIAGNOSIS — Z808 Family history of malignant neoplasm of other organs or systems: Secondary | ICD-10-CM

## 2020-05-14 DIAGNOSIS — Z8619 Personal history of other infectious and parasitic diseases: Secondary | ICD-10-CM

## 2020-05-14 DIAGNOSIS — Z0000 Encounter for general adult medical examination without abnormal findings: Secondary | ICD-10-CM | POA: Diagnosis not present

## 2020-05-14 DIAGNOSIS — M25512 Pain in left shoulder: Secondary | ICD-10-CM

## 2020-05-14 DIAGNOSIS — Z905 Acquired absence of kidney: Secondary | ICD-10-CM

## 2020-05-14 DIAGNOSIS — K219 Gastro-esophageal reflux disease without esophagitis: Secondary | ICD-10-CM | POA: Diagnosis not present

## 2020-05-14 DIAGNOSIS — Z125 Encounter for screening for malignant neoplasm of prostate: Secondary | ICD-10-CM

## 2020-05-14 DIAGNOSIS — Z7185 Encounter for immunization safety counseling: Secondary | ICD-10-CM | POA: Diagnosis not present

## 2020-05-14 DIAGNOSIS — F325 Major depressive disorder, single episode, in full remission: Secondary | ICD-10-CM | POA: Insufficient documentation

## 2020-05-14 DIAGNOSIS — E785 Hyperlipidemia, unspecified: Secondary | ICD-10-CM

## 2020-05-14 DIAGNOSIS — K7689 Other specified diseases of liver: Secondary | ICD-10-CM | POA: Insufficient documentation

## 2020-05-14 DIAGNOSIS — M25551 Pain in right hip: Secondary | ICD-10-CM | POA: Insufficient documentation

## 2020-05-14 DIAGNOSIS — I10 Essential (primary) hypertension: Secondary | ICD-10-CM

## 2020-05-14 DIAGNOSIS — G8929 Other chronic pain: Secondary | ICD-10-CM

## 2020-05-14 DIAGNOSIS — Z113 Encounter for screening for infections with a predominantly sexual mode of transmission: Secondary | ICD-10-CM

## 2020-05-14 NOTE — Progress Notes (Signed)
Subjective:   HPI  Chase Rogers is a 49 y.o. male who presents for Chief Complaint  Patient presents with  . Annual Exam    Physical with fasting labs     Medical care team includes Braylen Staller, Camelia Eng, PA-C here for primary care Dentist Eye doctor Dr. Thornton Park, GI  Concerns: Been having some pains in left shoulder, top of shoulder.  No injury, no trauma.  Had similar pains 5 years ago as well.  He does recall injury 5 years ago where he picked something up and the shoulder dropped out, had pain associated with that issue.  Shoulder can hurt at night, particularly when raising arm over head.    Right hip still bothering.   He was seen here earlier in the year for same.  Saw Vickie NP here in August for possible diverticulitis flare.  ended up seeing GI after that, had CT scan.   CT showed no diverticulitis, but there was some lesions that was further evaluated with MRI abdomen.     Still has ongoing left foot toenail fungus problems.  Not good about using the topical.  Wants to try oral antifungal  No other complaint  Reviewed their medical, surgical, family, social, medication, and allergy history and updated chart as appropriate.  Past Medical History:  Diagnosis Date  . Allergy   . Anxiety   . Congenital single kidney    per prior ultrasound and other imaging  . Depression   . GERD (gastroesophageal reflux disease)   . High triglycerides   . History of EKG 2016   normal per report  . Hyperlipidemia   . Hypertension 2004  . Migraine   . Renal agenesis   . Wart 2014   finger  . Wears contact lenses     Past Surgical History:  Procedure Laterality Date  . COLONOSCOPY  2012   abdominal pain, brother hx/o Crohns.  normal per patient  . KNEE SURGERY  2007   right meniscal tear  . MINOR FULGERATION OF ANAL CONDYLOMA  2013  . WISDOM TOOTH EXTRACTION      Social History   Socioeconomic History  . Marital status: Married    Spouse name: Not on file   . Number of children: Not on file  . Years of education: Not on file  . Highest education level: Not on file  Occupational History  . Not on file  Tobacco Use  . Smoking status: Former Smoker    Packs/day: 0.50    Years: 10.00    Pack years: 5.00    Types: Cigarettes    Quit date: 2020    Years since quitting: 1.9  . Smokeless tobacco: Never Used  Vaping Use  . Vaping Use: Never used  Substance and Sexual Activity  . Alcohol use: Yes    Comment: occ  . Drug use: No  . Sexual activity: Yes  Other Topics Concern  . Not on file  Social History Narrative   Lives with husband.  Exercise -[employer] regularly.   Eats relatively healthy.   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 Arthritis Mother   .  Hyperlipidemia Mother   . Hypertension Mother   . Melanoma Mother   . Arthritis Father        TKR  . Hypertension Father   . Crohn's disease Brother   . Stomach cancer Maternal Grandmother   . Breast cancer Maternal Grandmother   . Heart disease Maternal Grandfather   . Heart attack Maternal Grandfather   . Colon cancer Paternal Grandmother   . Heart disease Paternal Uncle   . Heart attack Paternal Uncle   . Heart attack Maternal Uncle   . Esophageal cancer Neg Hx   . Pancreatic cancer Neg Hx   . Liver disease Neg Hx      Current Outpatient Medications:  .  buPROPion (WELLBUTRIN SR) 150 MG 12 hr tablet, TAKE 1 TABLET BY MOUTH TWICE A DAY, Disp: 180 tablet, Rfl: 0 .  esomeprazole (NEXIUM) 20 MG capsule, Take 20 mg by mouth daily at 12 noon., Disp: , Rfl:  .  lisinopril (ZESTRIL) 5 MG tablet, Take 1 tablet (5 mg total) by mouth daily.,  Disp: 90 tablet, Rfl: 3 .  Multiple Vitamin (MULTIVITAMIN) tablet, Take 1 tablet by mouth daily., Disp: , Rfl:  .  simvastatin (ZOCOR) 20 MG tablet, Take 1 tablet (20 mg total) by mouth daily., Disp: 90 tablet, Rfl: 0 .  VASCEPA 1 g capsule, TAKE 2 CAPSULES BY MOUTH TWICE A DAY, Disp: 360 capsule, Rfl: 0  Allergies  Allergen Reactions  . Erythromycin     Age 78, unsure of allergy     Review of Systems Constitutional: -fever, -chills, -sweats, -unexpected weight change, -decreased appetite, -fatigue Allergy: -sneezing, -itching, -congestion Dermatology: -changing moles, -rash, -lumps ENT: -runny nose, -ear pain, -sore throat, -hoarseness, -sinus pain, -teeth pain, - ringing in ears, -hearing loss, -nosebleeds Cardiology: -chest pain, -palpitations, -swelling, -difficulty breathing when lying flat, -waking up short of breath Respiratory: -cough, -shortness of breath, -difficulty breathing with exercise or exertion, -wheezing, -coughing up blood Gastroenterology: -abdominal pain, -nausea, -vomiting, -diarrhea, -constipation, -blood in stool, -changes in bowel movement, -difficulty swallowing or eating Hematology: -bleeding, -bruising  Musculoskeletal: +joint aches, -muscle aches, -joint swelling, -back pain, -neck pain, -cramping, -changes in gait Ophthalmology: denies vision changes, eye redness, itching, discharge Urology: -burning with urination, -difficulty urinating, -blood in urine, -urinary frequency, -urgency, -incontinence Neurology: -headache, -weakness, -tingling, -numbness, -memory loss, -falls, -dizziness Psychology: -depressed mood, -agitation, -sleep problems Male GU: no testicular mass, pain, no lymph nodes swollen, no swelling, no rash.     Objective:  BP 122/84   Pulse 96   Ht 6' (1.829 m)   Wt 189 lb 12.8 oz (86.1 kg)   SpO2 96%   BMI 25.74 kg/m   General appearance: alert, no distress, WD/WN, Caucasian male Skin: left scrotum with several small 2-3 mm diameter  red brown macules unchanged for years, several scattered macules, no worrisome lesions, left upper back with tattoo of a little red devil, right inguinal region with tattoo of a red scorpion, there is a faint reticular flat pink rash of both legs, more prominent upper thighs bilat. HEENT: normocephalic, conjunctiva/corneas normal, sclerae anicteric, PERRLA, EOMi, nares patent, no discharge or erythema, pharynx normal Oral cavity: just posterior to and superior oral cavity is a 1cm diameter area of erythema and irritation, otherwise MMM, tongue normal, teeth in good repair Neck: supple, no lymphadenopathy, no thyromegaly, no masses, normal ROM, no bruits Chest: non tender, normal shape and expansion Heart: RRR, normal S1, S2, no murmurs Lungs: CTA bilaterally, no wheezes, rhonchi, or rales  Abdomen: +bs, soft, non tender, non distended, no masses, no hepatomegaly, no splenomegaly, no bruits Back: non tender, normal ROM, no scoliosis Musculoskeletal: left shoulder nontender, but pain with shoulder flexion and abduction over 90 degrees, tender with labral test, decreased internal ROM.  Right hip nontender, no deformity, no pain with ROM.  otherwise  upper extremities non tender, no obvious deformity, normal ROM throughout, lower extremities non tender, no obvious deformity, normal ROM throughout Extremities: no edema, no cyanosis, no clubbing Pulses: 2+ symmetric, upper and lower extremities, normal cap refill Neurological: alert, oriented x 3, CN2-12 intact, strength normal upper extremities and lower extremities, sensation normal throughout, DTRs 2+ throughout, no cerebellar signs, gait normal Psychiatric: normal affect, behavior normal, pleasant  GU: normal male external genitalia,circumcised, nontender, no masses, no hernia, no lymphadenopathy Rectal: deferred   Assessment and Plan :   Encounter Diagnoses  Name Primary?  . Routine general medical examination at a health care facility Yes  .  Essential hypertension, benign   . Gastroesophageal reflux disease without esophagitis   . Vaccine counseling   . Single kidney   . Screening for prostate cancer   . Hyperlipidemia, unspecified hyperlipidemia type   . History of syphilis   . Family history of melanoma   . Hepatic cyst   . Screen for STD (sexually transmitted disease)   . Chronic left shoulder pain   . Chronic right hip pain   . Depression, major, in remission [facility])     Physical exam - discussed and counseled on healthy lifestyle, diet, exercise, preventative care, vaccinations, sick and well care, proper use of emergency dept and after hours care, and addressed their concerns.    Today you had a preventative care visit or wellness visit.    Topics today may have included healthy lifestyle, diet, exercise, preventative care, vaccinations, sick and well care, proper use of emergency dept and after hours care, as well as other concerns.    Recommendations: Continue to return yearly for your annual wellness and preventative care visits.  This gives [place] a chance to discuss healthy lifestyle, exercise, vaccinations, review your chart record, and perform screenings where appropriate.  I recommend you see your eye doctor yearly for routine vision care.  I recommend you see your dentist yearly for routine dental care including hygiene visits twice yearly.   Vaccination recommendations were reviewed You are up-to-date on tetanus diphtheria and pertussis vaccine the Tdap vaccine You are up-to-date on hepatitis A and hepatitis B vaccine You are up to date on flu and covid vaccines    Screening for cancer: He has colonoscopy planned with Dr. Tarri Glenn after first of the year.  We discussed PSA, prostate exam, and prostate cancer screening risks/benefits.     Skin cancer screening:  Check your skin regularly for new changes, growing lesions, or other lesions of concern Come in for evaluation if you have skin lesions of  concern.  Lung cancer screening: If you have a greater than 30 pack year history of tobacco use, then you qualify for lung cancer screening with a chest CT scan  We currently don't have screenings for other cancers besides breast, cervical, colon, and lung cancers.  If you have a strong family history of cancer or have other cancer screening concerns, please let me know.    Bone health: Get at least 150 minutes of aerobic exercise weekly Get weight bearing exercise at least once weekly   Heart health: Get at least 150 minutes of aerobic exercise weekly  Limit alcohol It is important to maintain a healthy blood pressure and healthy cholesterol numbers   Screening for sexually transmitted infections: We discussed testing, prevention, and means of transmission   Separate significant issues discussed: Depression in remission - c/t current medication Wellbutrin  Hypertension, solitary kidney, continue lisinopril, but avoid in acute dehydration as discussed.  Avoid NSAIDs.  GERD- he uses over-the-counter Nexium as needed, avoid GERD triggers  Onychomycosis left foot only - discussed oral and topical lesions, pending labs, consider Lamisil oral.   Failed Jublia.  hyperlipidemia -c/t simvastatin and vasecpa  Hip pain, left shoulder pain, possible RTC vs labral issues - refer to ortho.  He will call back and let me know who he wants to see.   Andrey was seen today for annual exam.  Diagnoses and all orders for this visit:  Routine general medical examination at a health care facility -     Basic metabolic panel -     Lipid panel -     PSA -     HIV Antibody (routine testing w rflx) -     RPR -     GC/Chlamydia Probe Amp  Essential hypertension, benign  Gastroesophageal reflux disease without esophagitis  Vaccine counseling  Single kidney  Screening for prostate cancer -     PSA  Hyperlipidemia, unspecified hyperlipidemia type -     Lipid panel  History of  syphilis  Family history of melanoma  Hepatic cyst  Screen for STD (sexually transmitted disease) -     HIV Antibody (routine testing w rflx) -     RPR -     GC/Chlamydia Probe Amp  Chronic left shoulder pain  Chronic right hip pain  Depression, major, in remission ([place])     Follow-up pending labs, yearly for physical

## 2020-05-15 ENCOUNTER — Other Ambulatory Visit: Payer: Self-pay

## 2020-05-15 DIAGNOSIS — M25512 Pain in left shoulder: Secondary | ICD-10-CM

## 2020-05-15 LAB — LIPID PANEL
Chol/HDL Ratio: 4.2 ratio (ref 0.0–5.0)
Cholesterol, Total: 209 mg/dL — ABNORMAL HIGH (ref 100–199)
HDL: 50 mg/dL (ref >39)
LDL Chol Calc (NIH): 125 mg/dL — ABNORMAL HIGH (ref 0–99)
Triglycerides: 191 mg/dL — ABNORMAL HIGH (ref 0–149)
VLDL Cholesterol Cal: 34 mg/dL (ref 5–40)

## 2020-05-15 LAB — BASIC METABOLIC PANEL
BUN/Creatinine Ratio: 12 (ref 9–20)
BUN: 13 mg/dL (ref 6–24)
CO2: 23 mmol/L (ref 20–29)
Calcium: 10.3 mg/dL — ABNORMAL HIGH (ref 8.7–10.2)
Chloride: 99 mmol/L (ref 96–106)
Creatinine, Ser: 1.13 mg/dL (ref 0.76–1.27)
GFR calc Af Amer: 88 mL/min/{1.73_m2} (ref >59)
GFR calc non Af Amer: 76 mL/min/{1.73_m2} (ref >59)
Glucose: 108 mg/dL — ABNORMAL HIGH (ref 65–99)
Potassium: 4.3 mmol/L (ref 3.5–5.2)
Sodium: 138 mmol/L (ref 134–144)

## 2020-05-15 LAB — PSA: Prostate Specific Ag, Serum: 0.3 ng/mL (ref 0.0–4.0)

## 2020-05-15 LAB — RPR, QUANT+TP ABS (REFLEX)
Rapid Plasma Reagin, Quant: 1:8 {titer} — ABNORMAL HIGH
T Pallidum Abs: REACTIVE — AB

## 2020-05-15 LAB — RPR: RPR Ser Ql: REACTIVE — AB

## 2020-05-15 LAB — HIV ANTIBODY (ROUTINE TESTING W REFLEX): HIV Screen 4th Generation wRfx: NONREACTIVE

## 2020-05-16 LAB — GC/CHLAMYDIA PROBE AMP
Chlamydia trachomatis, NAA: NEGATIVE
Neisseria Gonorrhoeae by PCR: NEGATIVE

## 2020-05-19 ENCOUNTER — Other Ambulatory Visit: Payer: Self-pay | Admitting: Medical

## 2020-05-19 MED ORDER — TERBINAFINE HCL 250 MG PO TABS: 250.0000 mg | ORAL_TABLET | Freq: Every day | ORAL | 0 refills | Status: DC

## 2020-05-19 MED ORDER — ROSUVASTATIN CALCIUM 10 MG PO TABS: 10.0000 mg | ORAL_TABLET | Freq: Every day | ORAL | 3 refills | Status: DC

## 2020-05-21 ENCOUNTER — Other Ambulatory Visit: Payer: Self-pay | Admitting: Medical

## 2020-06-02 ENCOUNTER — Other Ambulatory Visit: Payer: Managed Care, Other (non HMO)

## 2020-06-09 ENCOUNTER — Other Ambulatory Visit: Payer: Managed Care, Other (non HMO)

## 2020-06-12 ENCOUNTER — Other Ambulatory Visit: Payer: Managed Care, Other (non HMO)

## 2020-06-12 ENCOUNTER — Other Ambulatory Visit: Payer: Self-pay

## 2020-06-13 LAB — PTH, INTACT AND CALCIUM
Calcium: 10.1 mg/dL (ref 8.7–10.2)
PTH: 34 pg/mL (ref 15–65)

## 2020-06-26 ENCOUNTER — Other Ambulatory Visit: Payer: Self-pay | Admitting: Medical

## 2020-07-17 ENCOUNTER — Other Ambulatory Visit: Payer: Self-pay | Admitting: Medical

## 2020-07-20 ENCOUNTER — Other Ambulatory Visit: Payer: Self-pay | Admitting: Family Medicine

## 2020-07-21 NOTE — Telephone Encounter (Signed)
Is this okay to refill? 

## 2020-07-24 ENCOUNTER — Telehealth: Payer: Self-pay | Admitting: Medical

## 2020-07-26 ENCOUNTER — Telehealth: Payer: Self-pay

## 2020-07-26 NOTE — Telephone Encounter (Signed)
P.A. VASCEPA  completed 

## 2020-07-29 NOTE — Telephone Encounter (Signed)
error 

## 2020-08-04 ENCOUNTER — Other Ambulatory Visit: Payer: Self-pay | Admitting: Medical

## 2020-08-04 NOTE — Addendum Note (Signed)
Addended by: Edgar Frisk on: 08/04/2020 04:56 PM   Modules accepted: Orders

## 2020-08-08 NOTE — Telephone Encounter (Signed)
PA approved.

## 2020-08-14 ENCOUNTER — Other Ambulatory Visit: Payer: Self-pay | Admitting: Medical

## 2020-08-14 NOTE — Telephone Encounter (Signed)
Pt informed, he has picked up.  Please sent in refills. Pt is doing fine on Vascepa

## 2020-08-14 NOTE — Telephone Encounter (Signed)
Vasecpa was sent in 06/2020 for 90 day supply

## 2020-08-22 NOTE — Telephone Encounter (Signed)
done

## 2020-09-01 ENCOUNTER — Other Ambulatory Visit: Payer: Self-pay | Admitting: Medical

## 2020-09-08 ENCOUNTER — Other Ambulatory Visit: Payer: Self-pay

## 2020-09-08 DIAGNOSIS — Z1211 Encounter for screening for malignant neoplasm of colon: Secondary | ICD-10-CM

## 2020-09-11 ENCOUNTER — Other Ambulatory Visit: Payer: Self-pay | Admitting: Medical

## 2020-09-24 ENCOUNTER — Telehealth: Payer: Self-pay | Admitting: Gastroenterology

## 2020-09-24 NOTE — Telephone Encounter (Signed)
Pt calling to schedule colon. Please advise if he can be a direct or does he need an OV prior to procedure.

## 2020-09-24 NOTE — Telephone Encounter (Signed)
Okay to proceed with colonoscopy. Thank you.  

## 2020-09-24 NOTE — Telephone Encounter (Signed)
See note below from Dr. Tarri Glenn, ok for direct colon.

## 2020-09-24 NOTE — Telephone Encounter (Signed)
Patient called in needing to schedule colonoscopy per Dr. Tarri Glenn recommendations. He has stated that he had a colonoscopy before but do not remember if been 10 years or less. He doesn't remember where it was except in [place]. Is it okay to schedule patient for direct colon?   Please advise.

## 2020-09-25 ENCOUNTER — Ambulatory Visit: Payer: BC Managed Care – PPO | Admitting: Family Medicine

## 2020-09-25 ENCOUNTER — Encounter: Payer: Self-pay | Admitting: Gastroenterology

## 2020-09-25 ENCOUNTER — Other Ambulatory Visit: Payer: Self-pay

## 2020-09-25 ENCOUNTER — Encounter: Payer: Self-pay | Admitting: Family Medicine

## 2020-09-25 VITALS — BP 130/90 | HR 80 | Temp 97.4°F | Wt 184.6 lb

## 2020-09-25 DIAGNOSIS — Z8719 Personal history of other diseases of the digestive system: Secondary | ICD-10-CM | POA: Diagnosis not present

## 2020-09-25 DIAGNOSIS — R110 Nausea: Secondary | ICD-10-CM | POA: Diagnosis not present

## 2020-09-25 DIAGNOSIS — R1032 Left lower quadrant pain: Secondary | ICD-10-CM

## 2020-09-25 DIAGNOSIS — R10814 Left lower quadrant abdominal tenderness: Secondary | ICD-10-CM | POA: Diagnosis not present

## 2020-09-25 LAB — CBC WITH DIFFERENTIAL/PLATELET
Basophils Absolute: 0 10*3/uL (ref 0.0–0.2)
Basos: 1 %
EOS (ABSOLUTE): 0.1 10*3/uL (ref 0.0–0.4)
Eos: 1 %
Hematocrit: 48.6 % (ref 37.5–51.0)
Hemoglobin: 16.5 g/dL (ref 13.0–17.7)
Immature Grans (Abs): 0 10*3/uL (ref 0.0–0.1)
Immature Granulocytes: 0 %
Lymphocytes Absolute: 1.8 10*3/uL (ref 0.7–3.1)
Lymphs: 33 %
MCH: 29.4 pg (ref 26.6–33.0)
MCHC: 34 g/dL (ref 31.5–35.7)
MCV: 87 fL (ref 79–97)
Monocytes Absolute: 0.6 10*3/uL (ref 0.1–0.9)
Monocytes: 11 %
Neutrophils Absolute: 2.9 10*3/uL (ref 1.4–7.0)
Neutrophils: 54 %
Platelets: 374 10*3/uL (ref 150–450)
RBC: 5.61 x10E6/uL (ref 4.14–5.80)
RDW: 13.1 % (ref 11.6–15.4)
WBC: 5.3 10*3/uL (ref 3.4–10.8)

## 2020-09-25 LAB — COMPREHENSIVE METABOLIC PANEL
ALT: 34 IU/L (ref 0–44)
AST: 36 IU/L (ref 0–40)
Albumin/Globulin Ratio: 2.4 — ABNORMAL HIGH (ref 1.2–2.2)
Albumin: 5.3 g/dL — ABNORMAL HIGH (ref 4.0–5.0)
Alkaline Phosphatase: 106 IU/L (ref 44–121)
BUN/Creatinine Ratio: 14 (ref 9–20)
BUN: 18 mg/dL (ref 6–24)
Bilirubin Total: 0.5 mg/dL (ref 0.0–1.2)
CO2: 24 mmol/L (ref 20–29)
Calcium: 10.6 mg/dL — ABNORMAL HIGH (ref 8.7–10.2)
Chloride: 95 mmol/L — ABNORMAL LOW (ref 96–106)
Creatinine, Ser: 1.25 mg/dL (ref 0.76–1.27)
Globulin, Total: 2.2 g/dL (ref 1.5–4.5)
Glucose: 86 mg/dL (ref 65–99)
Potassium: 5.1 mmol/L (ref 3.5–5.2)
Sodium: 136 mmol/L (ref 134–144)
Total Protein: 7.5 g/dL (ref 6.0–8.5)
eGFR: 71 mL/min/{1.73_m2} (ref >59)

## 2020-09-25 MED ORDER — ONDANSETRON 4 MG PO TBDP: 4.0000 mg | ORAL_TABLET | Freq: Three times a day (TID) | ORAL | 0 refills | Status: DC | PRN

## 2020-09-25 MED ORDER — AMOXICILLIN-POT CLAVULANATE 875-125 MG PO TABS: 1.0000 | ORAL_TABLET | Freq: Two times a day (BID) | ORAL | 0 refills | Status: DC

## 2020-09-25 NOTE — Telephone Encounter (Signed)
Thank you. Have him scheduled PV 7/5. Direct colon 7/19.

## 2020-09-25 NOTE — Progress Notes (Signed)
   Subjective:    Patient ID: Chase Rogers, male    DOB: 07/26/1970, 50 y.o.   MRN: [id number]  HPI Chief Complaint  Patient presents with  . left side pain    Left side pain, lower for the last 2 weeks   Complains of LLQ pain 1 1/2-2 weeks ago and worsening. States the pain similar to the last time he had suspected diverticulitis.  The pain is quite constant now and nonradiating.  Nothing seems to aggravate or alleviate his pain. Reports associated nausea  No fever, chills, vomiting or diarrhea.    Review of Systems Pertinent positives and negatives in the history of present illness.     Objective:   Physical Exam BP 130/90   Pulse 80   Temp (!) 97.4 F (36.3 C)   Wt 184 lb 9.6 oz (83.7 kg)   BMI 25.04 kg/m   Alert and oriented and in no acute distress. Cardiac exam shows a regular sinus rhythm without murmurs or gallops. Lungs are clear to auscultation.  Abdomen is soft, nondistended, normal bowel sounds, tenderness to palpation to the left lower quadrant without rebound or guarding, no referred pain, nontender elsewhere.  Extremities without edema.  Skin is warm and dry.       Assessment & Plan:  Left lower quadrant abdominal tenderness without rebound tenderness - Plan: CBC with Differential/Platelet, Comprehensive metabolic panel, amoxicillin-clavulanate (AUGMENTIN) 875-125 MG tablet  Left lower quadrant abdominal pain - Plan: CBC with Differential/Platelet, Comprehensive metabolic panel, amoxicillin-clavulanate (AUGMENTIN) 875-125 MG tablet  History of diverticulosis - Plan: amoxicillin-clavulanate (AUGMENTIN) 875-125 MG tablet  Nausea - Plan: ondansetron (ZOFRAN ODT) 4 MG disintegrating tablet  Discussed patient with Dr. Redmond School and we agreed the benefit of treating him with antibiotics for suspected diverticulitis outweighs the risks.  Review of CT following his last episode of abdominal pain showed diverticulosis.  He will take the antibiotic, Zofran as  needed for nausea and follow-up with GI if not improving or if he is worsening.

## 2020-10-22 ENCOUNTER — Other Ambulatory Visit: Payer: Self-pay | Admitting: Medical

## 2020-10-22 ENCOUNTER — Telehealth: Payer: Self-pay | Admitting: Family Medicine

## 2020-10-22 DIAGNOSIS — B351 Tinea unguium: Secondary | ICD-10-CM

## 2020-10-22 NOTE — Telephone Encounter (Signed)
Ok to refill 

## 2020-10-24 NOTE — Telephone Encounter (Signed)
Then lets refer to dermatology.  i'll put in referral

## 2020-10-24 NOTE — Telephone Encounter (Signed)
Pt states he has been taking this for 5 months and he is only 40-50% better. Please advise

## 2020-10-24 NOTE — Telephone Encounter (Signed)
I received a refill request for Lamisil oral tablet.  At this point how many rounds or months of this has he done?  How are the nails looking at this point?

## 2020-10-24 NOTE — Telephone Encounter (Signed)
Pt was notified.  

## 2020-11-05 NOTE — Telephone Encounter (Signed)
Referral to Dermatology was denied. See notes below left by [facility] Dermatology office. Please advise.  "Rejection Reason - Patient went elsewhere - This was sent by error - pt does not need dermatology assistance. pt is scheduled for an endoscopy. Please review documentation" Otila Kluver said on Nov 04, 2020 3:15 PM

## 2020-11-05 NOTE — Telephone Encounter (Signed)
Noted  

## 2020-11-18 ENCOUNTER — Other Ambulatory Visit: Payer: Self-pay | Admitting: Medical

## 2020-11-18 ENCOUNTER — Telehealth: Payer: Self-pay | Admitting: Internal Medicine

## 2020-11-18 MED ORDER — ICOSAPENT ETHYL 1 G PO CAPS: 2.0000 g | ORAL_CAPSULE | Freq: Two times a day (BID) | ORAL | 0 refills | Status: DC

## 2020-11-18 NOTE — Telephone Encounter (Signed)
Pt insurance is requiring that he be switched from vascepa caps to icosapent ethyl caps to [facility]. Is this okay to make this change

## 2020-11-18 NOTE — Telephone Encounter (Signed)
Sent in change in med

## 2020-11-21 HISTORY — PX: SHOULDER ARTHROSCOPY: SHX128

## 2020-11-21 HISTORY — PX: COLONOSCOPY W/ BIOPSIES: SHX1374

## 2020-11-25 ENCOUNTER — Ambulatory Visit (AMBULATORY_SURGERY_CENTER): Payer: BC Managed Care – PPO

## 2020-11-25 VITALS — Ht 72.0 in | Wt 176.0 lb

## 2020-11-25 DIAGNOSIS — Z1211 Encounter for screening for malignant neoplasm of colon: Secondary | ICD-10-CM

## 2020-11-25 MED ORDER — PEG-KCL-NACL-NASULF-NA ASC-C 100 G PO SOLR: 1.0000 | Freq: Once | ORAL | 0 refills | Status: AC

## 2020-11-25 NOTE — Telephone Encounter (Signed)
Done KH 

## 2020-11-25 NOTE — Progress Notes (Signed)
No egg or soy allergy known to patient  No issues with past sedation with any surgeries or procedures Patient denies ever being told they had issues or difficulty with intubation  No FH of Malignant Hyperthermia No diet pills per patient No home 02 use per patient  No blood thinners per patient  Pt denies issues with constipation  No A fib or A flutter  EMMI video to pt or via [place] 19 guidelines implemented in [place] today with Pt and RN  Pt is fully vaccinated  for [employer]  Coupon given to pt in PV today , Code to Pharmacy and  NO PA's for preps discussed with pt In PV today  Discussed with pt there will be an out-of-pocket cost for prep and that varies from $0 to 70 dollars   Due to the COVID-19 pandemic we are asking patients to follow certain guidelines.  Pt aware of COVID protocols and [facility] guidelines

## 2020-12-09 ENCOUNTER — Encounter: Payer: Self-pay | Admitting: Gastroenterology

## 2020-12-09 ENCOUNTER — Ambulatory Visit (AMBULATORY_SURGERY_CENTER): Payer: BC Managed Care – PPO | Admitting: Gastroenterology

## 2020-12-09 ENCOUNTER — Other Ambulatory Visit: Payer: Self-pay

## 2020-12-09 VITALS — BP 123/80 | HR 75 | Temp 98.6°F | Resp 12 | Ht 72.0 in | Wt 176.0 lb

## 2020-12-09 DIAGNOSIS — K5000 Crohn's disease of small intestine without complications: Secondary | ICD-10-CM

## 2020-12-09 DIAGNOSIS — K5730 Diverticulosis of large intestine without perforation or abscess without bleeding: Secondary | ICD-10-CM

## 2020-12-09 DIAGNOSIS — K621 Rectal polyp: Secondary | ICD-10-CM

## 2020-12-09 DIAGNOSIS — R1032 Left lower quadrant pain: Secondary | ICD-10-CM | POA: Diagnosis not present

## 2020-12-09 DIAGNOSIS — K625 Hemorrhage of anus and rectum: Secondary | ICD-10-CM

## 2020-12-09 DIAGNOSIS — Z1211 Encounter for screening for malignant neoplasm of colon: Secondary | ICD-10-CM

## 2020-12-09 DIAGNOSIS — D128 Benign neoplasm of rectum: Secondary | ICD-10-CM

## 2020-12-09 DIAGNOSIS — K633 Ulcer of intestine: Secondary | ICD-10-CM

## 2020-12-09 DIAGNOSIS — K5289 Other specified noninfective gastroenteritis and colitis: Secondary | ICD-10-CM

## 2020-12-09 DIAGNOSIS — K6289 Other specified diseases of anus and rectum: Secondary | ICD-10-CM

## 2020-12-09 DIAGNOSIS — K635 Polyp of colon: Secondary | ICD-10-CM

## 2020-12-09 DIAGNOSIS — D122 Benign neoplasm of ascending colon: Secondary | ICD-10-CM

## 2020-12-09 MED ORDER — SODIUM CHLORIDE 0.9 % IV SOLN: 500.0000 mL | Freq: Once | INTRAVENOUS | Status: DC

## 2020-12-09 NOTE — Progress Notes (Signed)
Report to PACU, RN, vss, BBS= Clear.  

## 2020-12-09 NOTE — Op Note (Signed)
[facility] Patient Name: Chase Rogers Procedure Date: 12/09/2020 2:34 PM MRN: [id number] Endoscopist: Thornton Park MD, MD Age: 50 Referring MD:  Date of Birth: [birth year] Gender: Male Account #: [id number] Procedure:                Colonoscopy Indications:              Abdominal pain in the left lower quadrant                           Rectal bleeding last year - attributed to                            hemorrhoids                           Occasional NSAIDs                           Brother with Crohns                           Family history of polyps (mother)                           Family history of colon cancer (paternal                            grandmother at age 27)                           No prior colon cancer screening Medicines:                Monitored Anesthesia Care Procedure:                Pre-Anesthesia Assessment:                           - Prior to the procedure, a History and Physical                            was performed, and patient medications and                            allergies were reviewed. The patient's tolerance of                            previous anesthesia was also reviewed. The risks                            and benefits of the procedure and the sedation                            options and risks were discussed with the patient.                            All questions were answered, and informed consent  was obtained. Prior Anticoagulants: The patient has                            taken no previous anticoagulant or antiplatelet                            agents. ASA Grade Assessment: II - A patient with                            mild systemic disease. After reviewing the risks                            and benefits, the patient was deemed in                            satisfactory condition to undergo the procedure.                           After obtaining informed consent, the  colonoscope                            was passed under direct vision. Throughout the                            procedure, the patient's blood pressure, pulse, and                            oxygen saturations were monitored continuously. The                            CF [id number] #[id number] was introduced through the anus                            and advanced to the 8 cm into the ileum. A second                            forward view of the right colon was performed. The                            colonoscopy was performed without difficulty. The                            patient tolerated the procedure well. The quality                            of the bowel preparation was good. The terminal                            ileum, ileocecal valve, appendiceal orifice, and                            rectum were photographed. Scope In: 2:40:52 PM Scope Out: 2:57:58 PM Scope Withdrawal Time: 0 hours 13 minutes  17 seconds  Total Procedure Duration: 0 hours 17 minutes 6 seconds  Findings:                 Non-bleeding external and internal hemorrhoids were                            found.                           Multiple small and large-mouthed diverticula were                            found in the sigmoid colon and distal descending                            colon.                           Two flat polyps were found in the rectum and                            ascending colon. The polyps were 1 to 2 mm in size.                            These polyps were removed with a cold snare.                            Resection and retrieval were complete. Estimated                            blood loss was minimal.                           The colonic mucosa otherwise appeared normal except                            for some patchy erythema in the rectum. Biopsies                            were taken with a cold forceps for histology.                            Estimated blood loss was  minimal.                           A patchy area of mucosa in the distal ileum was                            mildly congested, erythematous and ulcerated.                            Biopsies were taken with a cold forceps for                            histology. Estimated blood  loss was minimal.                           The exam was otherwise without abnormality on                            direct and retroflexion views. Complications:            No immediate complications. Estimated blood loss:                            Minimal. Estimated Blood Loss:     Estimated blood loss was minimal. Impression:               - Non-bleeding external and internal hemorrhoids.                           - Diverticulosis in the sigmoid colon and in the                            distal descending colon.                           - Two 1 to 2 mm polyps in the rectum and in the                            ascending colon, removed with a cold snare.                            Resected and retrieved.                           - The entire examined colon is normal. Biopsied.                           - Congested, erythematous and ulcerated mucosa in                            the distal ileum. Findings may be related to                            NSAIDs. Biopsied.                           - The examination was otherwise normal on direct                            and retroflexion views. Recommendation:           - Patient has a contact number available for                            emergencies. The signs and symptoms of potential                            delayed complications were discussed with the  patient. Return to normal activities tomorrow.                            Written discharge instructions were provided to the                            patient.                           - Continue present medications.                           - Avoid all NSAIDs.                            - Await pathology results.                           - Repeat colonoscopy in 5 years for surveillance                            given the family history.                           - Emerging evidence supports eating a diet of                            fruits, vegetables, grains, calcium, and yogurt                            while reducing red meat and alcohol may reduce the                            risk of colon cancer.                           - Follow a high fiber diet. Drink at least 64                            ounces of water daily. Add a daily stool bulking                            agent such as psyllium (an exampled would be                            Metamucil).                           - Thank you for allowing me to be involved in your                            colon cancer prevention. Thornton Park MD, MD 12/09/2020 3:05:36 PM This report has been signed electronically.

## 2020-12-09 NOTE — Patient Instructions (Signed)
YOU HAD AN ENDOSCOPIC PROCEDURE TODAY AT THE [facility]:   Refer to the procedure report that was given to you for any specific questions about what was found during the examination.  If the procedure report does not answer your questions, please call your gastroenterologist to clarify.  If you requested that your care partner not be given the details of your procedure findings, then the procedure report has been included in a sealed envelope for you to review at your convenience later.  YOU SHOULD EXPECT: Some feelings of bloating in the abdomen. Passage of more gas than usual.  Walking can help get rid of the air that was put into your GI tract during the procedure and reduce the bloating. If you had a lower endoscopy (such as a colonoscopy or flexible sigmoidoscopy) you may notice spotting of blood in your stool or on the toilet paper. If you underwent a bowel prep for your procedure, you may not have a normal bowel movement for a few days.  Please Note:  You might notice some irritation and congestion in your nose or some drainage.  This is from the oxygen used during your procedure.  There is no need for concern and it should clear up in a day or so.  SYMPTOMS TO REPORT IMMEDIATELY:   Following lower endoscopy (colonoscopy or flexible sigmoidoscopy):  Excessive amounts of blood in the stool  Significant tenderness or worsening of abdominal pains  Swelling of the abdomen that is new, acute  Fever of 100F or higher   Following upper endoscopy (EGD)  Vomiting of blood or coffee ground material  New chest pain or pain under the shoulder blades  Painful or persistently difficult swallowing  New shortness of breath  Fever of 100F or higher  Black, tarry-looking stools  For urgent or emergent issues, a gastroenterologist can be reached at any hour by calling [phone]. Do not use MyChart messaging for urgent concerns.    DIET:  We do recommend a small meal at first, but  then you may proceed to your regular diet.  Drink plenty of fluids but you should avoid alcoholic beverages for 24 hours.  ACTIVITY:  You should plan to take it easy for the rest of today and you should NOT DRIVE or use heavy machinery until tomorrow (because of the sedation medicines used during the test).    FOLLOW UP: Our staff will call the number listed on your records 48-72 hours following your procedure to check on you and address any questions or concerns that you may have regarding the information given to you following your procedure. If we do not reach you, we will leave a message.  We will attempt to reach you two times.  During this call, we will ask if you have developed any symptoms of COVID 19. If you develop any symptoms (ie: fever, flu-like symptoms, shortness of breath, cough etc.) before then, please call [phone].  If you test positive for Covid 19 in the 2 weeks post procedure, please call and report this information to us.    If any biopsies were taken you will be contacted by phone or by letter within the next 1-3 weeks.  Please call us at [phone] if you have not heard about the biopsies in 3 weeks.    SIGNATURES/CONFIDENTIALITY: You and/or your care partner have signed paperwork which will be entered into your electronic medical record.  These signatures attest to the fact that that the information above on   your After Visit Summary has been reviewed and is understood.  Full responsibility of the confidentiality of this discharge information lies with you and/or your care-partner. 

## 2020-12-09 NOTE — Progress Notes (Signed)
Called to room to assist during endoscopic procedure.  Patient ID and intended procedure confirmed with present staff. Received instructions for my participation in the procedure from the performing physician.  

## 2020-12-09 NOTE — Progress Notes (Signed)
Sx last 12/01/20.  Bruise to right hand from previous IV site last Monday. C.W. vital signs.

## 2020-12-30 ENCOUNTER — Encounter: Payer: Self-pay | Admitting: Gastroenterology

## 2020-12-30 ENCOUNTER — Telehealth: Payer: Self-pay

## 2020-12-30 NOTE — Telephone Encounter (Signed)
[place] VAS[employer] renewal received sent thru and stated already approved til 2025

## 2020-12-31 ENCOUNTER — Telehealth: Payer: Self-pay | Admitting: Gastroenterology

## 2020-12-31 NOTE — Telephone Encounter (Signed)
See result note.  

## 2021-01-21 ENCOUNTER — Other Ambulatory Visit: Payer: Self-pay | Admitting: Medical

## 2021-01-22 ENCOUNTER — Ambulatory Visit
Admission: RE | Admit: 2021-01-22 | Discharge: 2021-01-22 | Disposition: A | Discharge status: Home / Self Care | Payer: BC Managed Care – PPO | Source: Ambulatory Visit | Attending: Medical | Admitting: Medical

## 2021-01-22 ENCOUNTER — Other Ambulatory Visit: Payer: Self-pay

## 2021-01-22 ENCOUNTER — Ambulatory Visit: Payer: BC Managed Care – PPO | Admitting: Medical

## 2021-01-22 VITALS — BP 120/70 | HR 68 | Wt 178.2 lb

## 2021-01-22 DIAGNOSIS — M25531 Pain in right wrist: Secondary | ICD-10-CM

## 2021-01-22 DIAGNOSIS — M79644 Pain in right finger(s): Secondary | ICD-10-CM

## 2021-01-22 DIAGNOSIS — S6991XA Unspecified injury of right wrist, hand and finger(s), initial encounter: Secondary | ICD-10-CM

## 2021-01-22 NOTE — Patient Instructions (Signed)
Please go to [facility] for your wrist and thumb xray.   Their hours are 8am - 4:30 pm Monday - Friday.  Take your insurance card with you.  [facility] [phone]  [address], [place], [place] [address]  [address] [place], [place] [address]
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
## 2021-02-07 ENCOUNTER — Other Ambulatory Visit: Payer: Self-pay | Admitting: Family Medicine

## 2021-03-11 ENCOUNTER — Other Ambulatory Visit: Payer: Self-pay | Admitting: Medical

## 2021-04-18 ENCOUNTER — Other Ambulatory Visit: Payer: Self-pay | Admitting: Medical

## 2021-05-06 ENCOUNTER — Other Ambulatory Visit: Payer: Self-pay | Admitting: Medical

## 2021-05-19 ENCOUNTER — Other Ambulatory Visit: Payer: Self-pay

## 2021-05-19 ENCOUNTER — Ambulatory Visit: Payer: BC Managed Care – PPO | Admitting: Medical

## 2021-05-19 ENCOUNTER — Encounter: Payer: Self-pay | Admitting: Medical

## 2021-05-19 VITALS — BP 130/76 | HR 83 | Temp 97.7°F | Ht 71.0 in | Wt 177.6 lb

## 2021-05-19 DIAGNOSIS — E785 Hyperlipidemia, unspecified: Secondary | ICD-10-CM

## 2021-05-19 DIAGNOSIS — Z0000 Encounter for general adult medical examination without abnormal findings: Secondary | ICD-10-CM

## 2021-05-19 DIAGNOSIS — K219 Gastro-esophageal reflux disease without esophagitis: Secondary | ICD-10-CM

## 2021-05-19 DIAGNOSIS — Z125 Encounter for screening for malignant neoplasm of prostate: Secondary | ICD-10-CM

## 2021-05-19 DIAGNOSIS — Z23 Encounter for immunization: Secondary | ICD-10-CM | POA: Diagnosis not present

## 2021-05-19 DIAGNOSIS — Z905 Acquired absence of kidney: Secondary | ICD-10-CM

## 2021-05-19 DIAGNOSIS — I10 Essential (primary) hypertension: Secondary | ICD-10-CM

## 2021-05-19 DIAGNOSIS — Z808 Family history of malignant neoplasm of other organs or systems: Secondary | ICD-10-CM

## 2021-05-19 DIAGNOSIS — Z7185 Encounter for immunization safety counseling: Secondary | ICD-10-CM

## 2021-05-19 LAB — URINALYSIS
Bilirubin, UA: NEGATIVE
Glucose, UA: NEGATIVE
Ketones, UA: NEGATIVE
Leukocytes,UA: NEGATIVE
Nitrite, UA: NEGATIVE
Protein,UA: NEGATIVE
RBC, UA: NEGATIVE
Specific Gravity, UA: 1.018 (ref 1.005–1.030)
Urobilinogen, Ur: 0.2 mg/dL (ref 0.2–1.0)
pH, UA: 6 (ref 5.0–7.5)

## 2021-05-19 NOTE — Progress Notes (Signed)
Subjective:   HPI  Chase Rogers is a 50 y.o. male who presents for Chief Complaint  Patient presents with   Annual Exam    Medical care team includes Jethro Radke, Camelia Eng, PA-C here for primary care Dentist Eye doctor Dr. Thornton Park, GI  Concerns: Hypertension - compliant with Lisinopril daily.  Not checking home BPs.  No current symptoms of concern  Hyperlipidemia - compliant with both Vascepa 2 capsules BID and Rosuvastatin daily  GERD- no c/o, using omeprazole 20mg  regularly  Mood has been fine, compliant with Wellbutrin 150mg  SR  No other complaint  Reviewed their medical, surgical, family, social, medication, and allergy history and updated chart as appropriate.  Past Medical History:  Diagnosis Date   Allergy    Anxiety    Congenital single kidney    per prior ultrasound and other imaging   Depression    GERD (gastroesophageal reflux disease)    High triglycerides    History of EKG 2016   normal per report   Hyperlipidemia    Hypertension 2004   Migraine    Renal agenesis    Wart 2014   finger   Wears contact lenses     Past Surgical History:  Procedure Laterality Date   COLONOSCOPY  2012   abdominal pain, brother hx/o Crohns.  normal per patient   COLONOSCOPY W/ BIOPSIES  11/2020   KNEE SURGERY  2007   right meniscal tear   MINOR FULGERATION OF ANAL CONDYLOMA  2013   SHOULDER ARTHROSCOPY Left 11/2020   WISDOM TOOTH EXTRACTION      Social History   Socioeconomic History   Marital status: Married    Spouse name: Not on file   Number of children: Not on file   Years of education: Not on file   Highest education level: Not on file  Occupational History   Not on file  Tobacco Use   Smoking status: Former    Packs/day: 0.50    Years: 10.00    Pack years: 5.00    Types: Cigarettes    Quit date: 2020    Years since quitting: 2.9    Passive exposure: Never   Smokeless tobacco: Never  Vaping Use   Vaping Use: Never used   Substance and Sexual Activity   Alcohol use: Yes    Comment: occ less than weekly   Drug use: No   Sexual activity: Yes  Other Topics Concern   Not on file  Social History Narrative   Lives with husband.  Exercise -[employer] regularly.   Eats relatively healthy.   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   Social Determinants of Health   Financial Resource Strain: Not on file  Food Insecurity: Not on file  Transportation Needs: Not on file  Physical Activity: Not on file  Stress: Not on file  Social Connections: Not on file  Intimate Partner Violence: Not on file    Family History  Problem Relation Age of Onset   Arthritis Mother    Hyperlipidemia Mother    Hypertension Mother    Melanoma Mother    Arthritis Father        TKR   Hypertension Father    Crohn's disease Brother    Heart attack Maternal Uncle    Heart disease Paternal Uncle    Heart attack Paternal Uncle    Stomach cancer Maternal Grandmother    Breast cancer Maternal Grandmother    Heart disease Maternal Grandfather    Heart  attack Maternal Grandfather    Colon polyps Paternal Grandmother    Colon cancer Paternal Grandmother    Esophageal cancer Neg Hx    Pancreatic cancer Neg Hx    Liver disease Neg Hx    Rectal cancer Neg Hx      Current Outpatient Medications:    buPROPion (WELLBUTRIN SR) 150 MG 12 hr tablet, TAKE 1 TABLET BY MOUTH TWICE A DAY, Disp: 180 tablet, Rfl: 1   esomeprazole (NEXIUM) 20 MG capsule, Take 20 mg by mouth daily at 12 noon., Disp: , Rfl:    fluconazole (DIFLUCAN) 200 MG tablet, Take 200 mg by mouth once a week., Disp: , Rfl:    icosapent Ethyl (VASCEPA) 1 g capsule, TAKE 2 CAPSULES TWICE A DAY, Disp: 360 capsule, Rfl: 3   lisinopril (ZESTRIL) 5 MG tablet, TAKE 1 TABLET BY MOUTH EVERY DAY, Disp: 90 tablet, Rfl: 0   Multiple Vitamin (MULTIVITAMIN) tablet, Take 1 tablet by mouth daily., Disp: , Rfl:    rosuvastatin (CRESTOR) 10 MG tablet, TAKE 1 TABLET BY MOUTH  EVERY DAY, Disp: 90 tablet, Rfl: 0  Allergies  Allergen Reactions   Erythromycin     Age 33, unsure of allergy     Review of Systems Constitutional: -fever, -chills, -sweats, -unexpected weight change, -decreased appetite, -fatigue Allergy: -sneezing, -itching, -congestion Dermatology: -changing moles, -rash, -lumps ENT: -runny nose, -ear pain, -sore throat, -hoarseness, -sinus pain, -teeth pain, - ringing in ears, -hearing loss, -nosebleeds Cardiology: -chest pain, -palpitations, -swelling, -difficulty breathing when lying flat, -waking up short of breath Respiratory: -cough, -shortness of breath, -difficulty breathing with exercise or exertion, -wheezing, -coughing up blood Gastroenterology: -abdominal pain, -nausea, -vomiting, -diarrhea, -constipation, -blood in stool, -changes in bowel movement, -difficulty swallowing or eating Hematology: -bleeding, -bruising  Musculoskeletal: -joint aches, -muscle aches, -joint swelling, -back pain, -neck pain, -cramping, -changes in gait Ophthalmology: denies vision changes, eye redness, itching, discharge Urology: -burning with urination, -difficulty urinating, -blood in urine, -urinary frequency, -urgency, -incontinence Neurology: -headache, -weakness, -tingling, -numbness, -memory loss, -falls, -dizziness Psychology: -depressed mood, -agitation, -sleep problems Male GU: no testicular mass, pain, no lymph nodes swollen, no swelling, no rash.     Objective:  BP 130/76 (BP Location: Right Arm, Patient Position: Sitting)    Pulse 83    Temp 97.7 F (36.5 C) (Tympanic)    Ht 5\' 11"  (1.803 m)    Wt 177 lb 9.6 oz (80.6 kg)    SpO2 96%    BMI 24.77 kg/m   General appearance: alert, no distress, WD/WN, Caucasian male Skin: left scrotum with several small 2-3 mm diameter red brown macules unchanged for years, several scattered macules, no worrisome lesions, left upper back with tattoo of a little red devil, right inguinal region with tattoo of a red  scorpion, no other worrisome lesions HEENT: normocephalic, conjunctiva/corneas normal, sclerae anicteric, PERRLA, EOMi Neck: supple, no lymphadenopathy, no thyromegaly, no masses, normal ROM, no bruits Chest: non tender, normal shape and expansion Heart: RRR, normal S1, S2, no murmurs Lungs: CTA bilaterally, no wheezes, rhonchi, or rales Abdomen: +bs, soft, non tender, non distended, no masses, no hepatomegaly, no splenomegaly, no bruits Back: non tender, normal ROM, no scoliosis Musculoskeletal: no obvious deformity Extremities: no edema, no cyanosis, no clubbing Pulses: 2+ symmetric, upper and lower extremities, normal cap refill Neurological: alert, oriented x 3, CN2-12 intact, strength normal upper extremities and lower extremities, sensation normal throughout, DTRs 2+ throughout, no cerebellar signs, gait normal Psychiatric: normal affect, behavior normal, pleasant  GU:  normal male external genitalia,circumcised, nontender, no masses, no hernia, no lymphadenopathy Rectal: deferred   Assessment and Plan :   Encounter Diagnoses  Name Primary?   Routine general medical examination at a health care facility Yes   Need for COVID-19 vaccine    Screening for prostate cancer    Single kidney    Vaccine counseling    Hyperlipidemia, unspecified hyperlipidemia type    Essential hypertension, benign    Gastroesophageal reflux disease without esophagitis    Family history of melanoma     Physical exam - discussed and counseled on healthy lifestyle, diet, exercise, preventative care, vaccinations, sick and well care, proper use of emergency dept and after hours care, and addressed their concerns.    Today you had a preventative care visit or wellness visit.    Topics today may have included healthy lifestyle, diet, exercise, preventative care, vaccinations, sick and well care, proper use of emergency dept and after hours care, as well as other concerns.    Recommendations: Continue to  return yearly for your annual wellness and preventative care visits.  This gives [place] a chance to discuss healthy lifestyle, exercise, vaccinations, review your chart record, and perform screenings where appropriate.  I recommend you see your eye doctor yearly for routine vision care.  I recommend you see your dentist yearly for routine dental care including hygiene visits twice yearly.   Vaccination recommendations were reviewed Immunization History  Administered Date(s) Administered   Hepatitis A, Adult 05/25/2017, 01/05/2018   Hepatitis B, adult 05/25/2017, 06/27/2017, 01/05/2018   Influenza Inj Mdck Quad With Preservative 04/08/2018   Influenza,inj,Quad PF,6+ Mos 03/22/2019, 03/22/2019   Influenza-Unspecified 03/18/2016, 03/13/2017, 04/08/2018, 02/27/2020   Janssen (J&J) SARS-COV-2 Vaccination 08/03/2019   PFIZER SARS-COV-2 Pediatric Vaccination 5-20yrs 03/17/2020   Tdap 05/12/2018   Counseled on the Covid virus vaccine.  Vaccine information sheet given.  Covid vaccine given after consent obtained.   Screening for cancer: I reviewed 2022 colonoscopy up to date from Dr. Tarri Glenn, repeat in 5 years planned  We discussed PSA, prostate exam, and prostate cancer screening risks/benefits.     Skin cancer screening:  Check your skin regularly for new changes, growing lesions, or other lesions of concern Come in for evaluation if you have skin lesions of concern. Advised yearly dermatology surveillance given mother's history of melanoma  Lung cancer screening: If you have a greater than 30 pack year history of tobacco use, then you qualify for lung cancer screening with a chest CT scan  We currently don't have screenings for other cancers besides breast, cervical, colon, and lung cancers.  If you have a strong family history of cancer or have other cancer screening concerns, please let me know.    Bone health: Get at least 150 minutes of aerobic exercise weekly Get weight bearing  exercise at least once weekly   Heart health: Get at least 150 minutes of aerobic exercise weekly Limit alcohol It is important to maintain a healthy blood pressure and healthy cholesterol numbers   Screening for sexually transmitted infections: We discussed testing, prevention, and means of transmission   Separate significant issues discussed: Depression in remission - doing ok on current medication Wellbutrin  Hypertension, solitary kidney, continue lisinopril, but avoid in acute dehydration as discussed.  Avoid NSAIDs.  GERD- he uses over-the-counter Nexium as needed, avoid GERD triggers.  Discuss long term risks of medication  hyperlipidemia -continue Rosuvastatin and Vascepa, fasting labs today    Bryer was seen today for annual exam.  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

## 2021-05-19 NOTE — Patient Instructions (Signed)
Physical exam - discussed and counseled on healthy lifestyle, diet, exercise, preventative care, vaccinations, sick and well care, proper use of emergency dept and after hours care, and addressed their concerns.    Today you had a preventative care visit or wellness visit.    Topics today may have included healthy lifestyle, diet, exercise, preventative care, vaccinations, sick and well care, proper use of emergency dept and after hours care, as well as other concerns.    Recommendations: Continue to return yearly for your annual wellness and preventative care visits.  This gives [place] a chance to discuss healthy lifestyle, exercise, vaccinations, review your chart record, and perform screenings where appropriate.  I recommend you see your eye doctor yearly for routine vision care.  I recommend you see your dentist yearly for routine dental care including hygiene visits twice yearly.   Vaccination recommendations were reviewed Immunization History  Administered Date(s) Administered   Hepatitis A, Adult 05/25/2017, 01/05/2018   Hepatitis B, adult 05/25/2017, 06/27/2017, 01/05/2018   Influenza Inj Mdck Quad With Preservative 04/08/2018   Influenza,inj,Quad PF,6+ Mos 03/22/2019, 03/22/2019   Influenza-Unspecified 03/18/2016, 03/13/2017, 04/08/2018, 02/27/2020   Janssen (J&J) SARS-COV-2 Vaccination 08/03/2019   PFIZER SARS-COV-2 Pediatric Vaccination 5-81yrs 03/17/2020   Tdap 05/12/2018   Counseled on the Covid virus vaccine.  Vaccine information sheet given.  Covid vaccine given after consent obtained.   Screening for cancer: I reviewed 2022 colonoscopy up to date from Dr. Tarri Glenn, repeat in 5 years planned  We discussed PSA, prostate exam, and prostate cancer screening risks/benefits.     Skin cancer screening:  Check your skin regularly for new changes, growing lesions, or other lesions of concern Come in for evaluation if you have skin lesions of concern. Advised yearly dermatology  surveillance given mother's history of melanoma  Lung cancer screening: If you have a greater than 30 pack year history of tobacco use, then you qualify for lung cancer screening with a chest CT scan  We currently don't have screenings for other cancers besides breast, cervical, colon, and lung cancers.  If you have a strong family history of cancer or have other cancer screening concerns, please let me know.    Bone health: Get at least 150 minutes of aerobic exercise weekly Get weight bearing exercise at least once weekly   Heart health: Get at least 150 minutes of aerobic exercise weekly Limit alcohol It is important to maintain a healthy blood pressure and healthy cholesterol numbers   Screening for sexually transmitted infections: We discussed testing, prevention, and means of transmission   Separate significant issues discussed: Depression in remission - doing ok on current medication Wellbutrin  Hypertension, solitary kidney, continue lisinopril, but avoid in acute dehydration as discussed.  Avoid NSAIDs.  GERD- he uses over-the-counter Nexium as needed, avoid GERD triggers.  Discuss long term risks of medication  hyperlipidemia -continue Rosuvastatin and Vascepa, fasting labs today

## 2021-05-20 ENCOUNTER — Other Ambulatory Visit: Payer: Self-pay | Admitting: Medical

## 2021-05-20 LAB — MICROALBUMIN / CREATININE URINE RATIO
Creatinine, Urine: 97.5 mg/dL
Microalb/Creat Ratio: 8 mg/g creat (ref 0–29)
Microalbumin, Urine: 7.9 ug/mL

## 2021-05-20 LAB — COMPREHENSIVE METABOLIC PANEL
ALT: 30 IU/L (ref 0–44)
AST: 33 IU/L (ref 0–40)
Albumin/Globulin Ratio: 1.8 (ref 1.2–2.2)
Albumin: 4.8 g/dL (ref 4.0–5.0)
Alkaline Phosphatase: 102 IU/L (ref 44–121)
BUN/Creatinine Ratio: 14 (ref 9–20)
BUN: 15 mg/dL (ref 6–24)
Bilirubin Total: 0.4 mg/dL (ref 0.0–1.2)
CO2: 22 mmol/L (ref 20–29)
Calcium: 10 mg/dL (ref 8.7–10.2)
Chloride: 100 mmol/L (ref 96–106)
Creatinine, Ser: 1.09 mg/dL (ref 0.76–1.27)
Globulin, Total: 2.6 g/dL (ref 1.5–4.5)
Glucose: 112 mg/dL — ABNORMAL HIGH (ref 70–99)
Potassium: 4.8 mmol/L (ref 3.5–5.2)
Sodium: 137 mmol/L (ref 134–144)
Total Protein: 7.4 g/dL (ref 6.0–8.5)
eGFR: 83 mL/min/{1.73_m2} (ref >59)

## 2021-05-20 LAB — CBC
Hematocrit: 47.6 % (ref 37.5–51.0)
Hemoglobin: 16 g/dL (ref 13.0–17.7)
MCH: 29.4 pg (ref 26.6–33.0)
MCHC: 33.6 g/dL (ref 31.5–35.7)
MCV: 88 fL (ref 79–97)
Platelets: 406 10*3/uL (ref 150–450)
RBC: 5.44 x10E6/uL (ref 4.14–5.80)
RDW: 12.3 % (ref 11.6–15.4)
WBC: 4.3 10*3/uL (ref 3.4–10.8)

## 2021-05-20 LAB — LIPID PANEL
Chol/HDL Ratio: 3.6 ratio (ref 0.0–5.0)
Cholesterol, Total: 185 mg/dL (ref 100–199)
HDL: 52 mg/dL (ref >39)
LDL Chol Calc (NIH): 113 mg/dL — ABNORMAL HIGH (ref 0–99)
Triglycerides: 114 mg/dL (ref 0–149)
VLDL Cholesterol Cal: 20 mg/dL (ref 5–40)

## 2021-05-20 LAB — PSA: Prostate Specific Ag, Serum: 1.4 ng/mL (ref 0.0–4.0)

## 2021-05-20 MED ORDER — ROSUVASTATIN CALCIUM 20 MG PO TABS: 20.0000 mg | ORAL_TABLET | Freq: Every day | ORAL | 0 refills | Status: DC

## 2021-06-11 ENCOUNTER — Telehealth: Payer: Self-pay | Admitting: Internal Medicine

## 2021-06-11 NOTE — Telephone Encounter (Signed)
Pt declines flu shot.  

## 2021-06-18 IMAGING — CT CT ABD-PELV W/ CM
2 of 5 series · 16 of 46 positions shown, 18 images · IV contrast (OMNIPAQUE 300)
Comparison: None.

CLINICAL DATA: Left lower quadrant pain. Suspected abdominal
abscess/infection. Follow-up history of diverticulitis in [REDACTED].

EXAM:
CT ABDOMEN AND PELVIS WITH CONTRAST
TECHNIQUE: Multidetector CT imaging of the abdomen and pelvis was performed
using the standard protocol following bolus administration of
intravenous contrast.
CONTRAST:  100mL OMNIPAQUE IOHEXOL 300 MG/ML  SOLN

[Series 2: abd/pel w · axial · 0.76mm/px · z∈[-510,-90]mm · 13 of 96 slices shown, 15 images]
[im 6/96  soft-tissue]
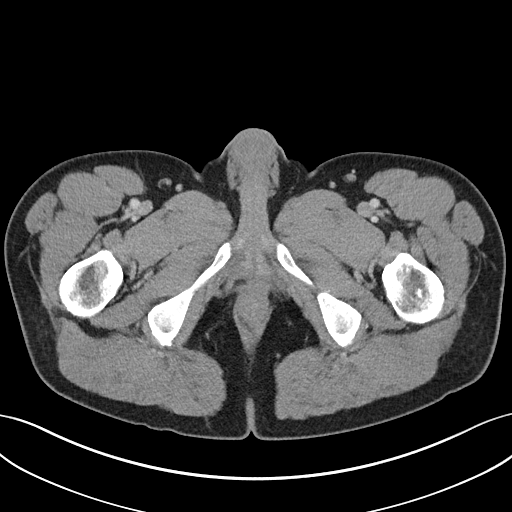
[im 6/96  bone]
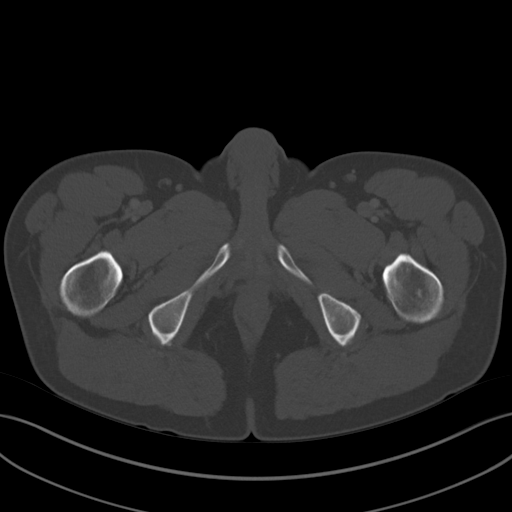
[im 11/96  soft-tissue]
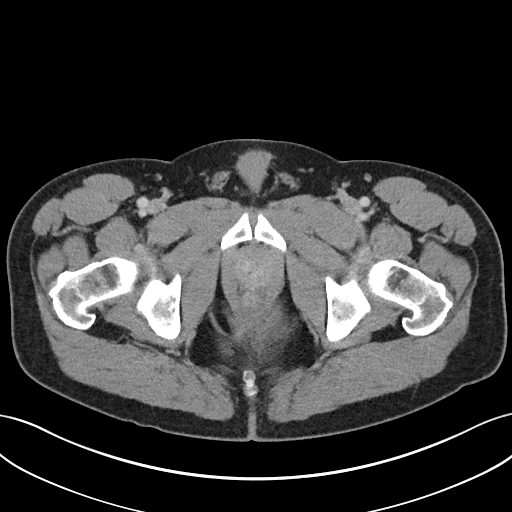
[im 22/96  soft-tissue]
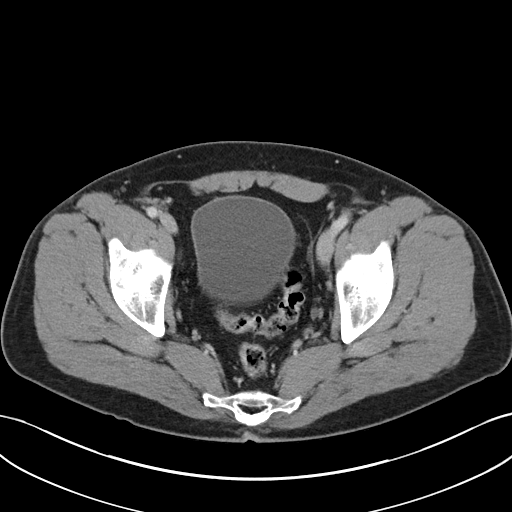
[im 27/96  soft-tissue]
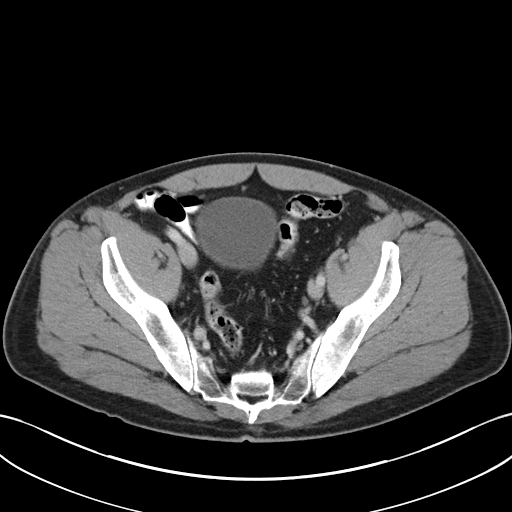
[im 32/96  soft-tissue]
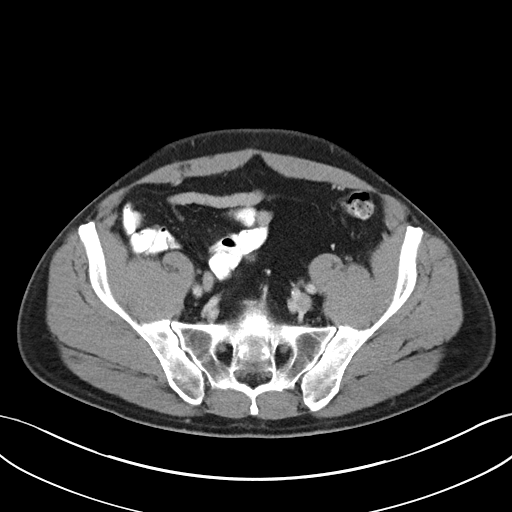
[im 43/96  soft-tissue]
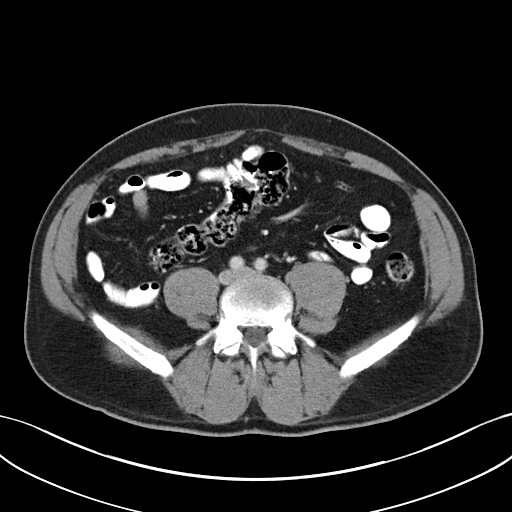
[im 48/96  soft-tissue]
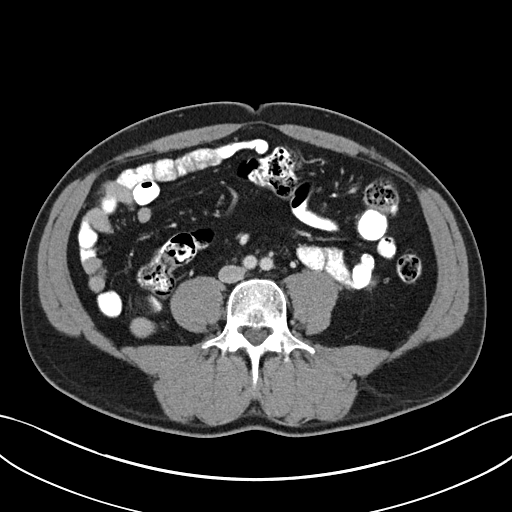
[im 53/96  soft-tissue]
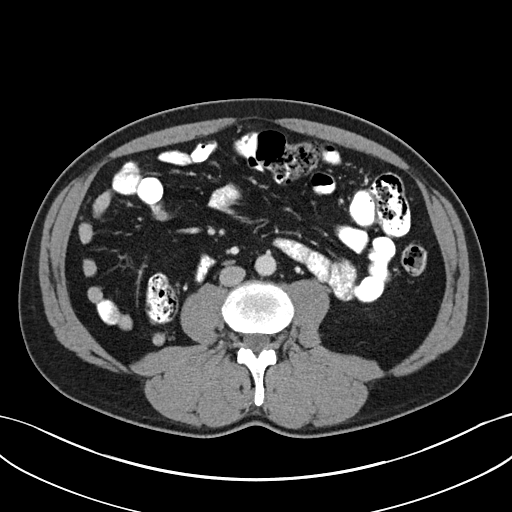
[im 64/96  soft-tissue]
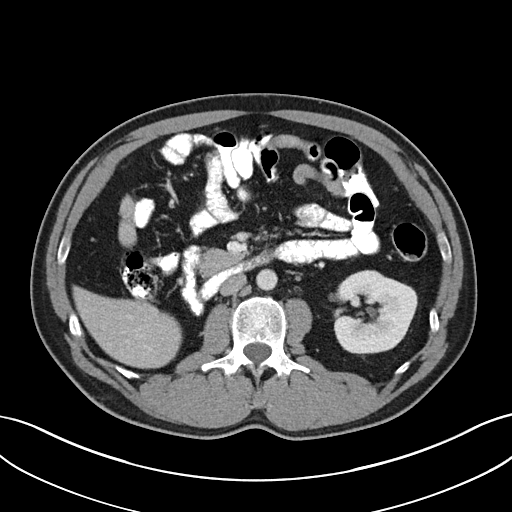
[im 64/96  bone]
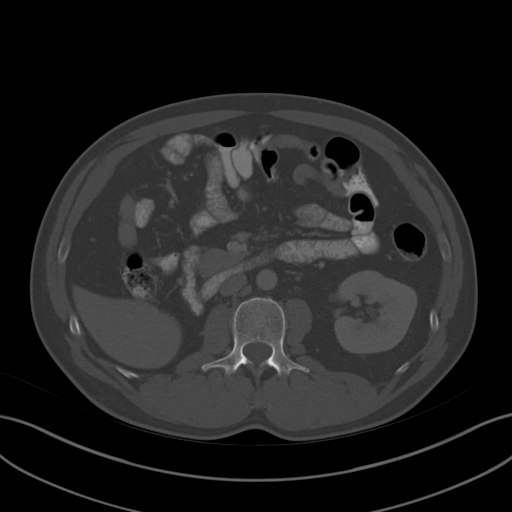
[im 69/96  soft-tissue]
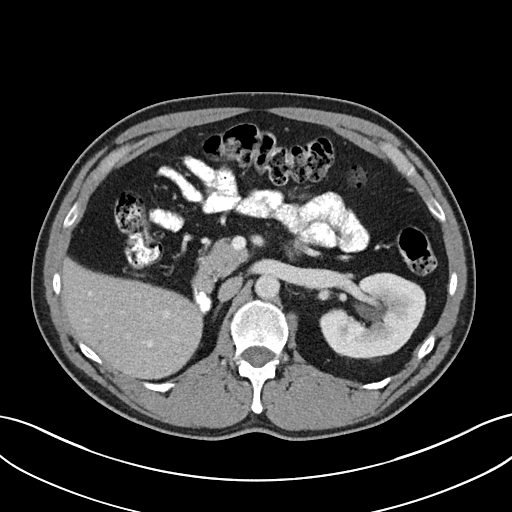
[im 74/96  soft-tissue]
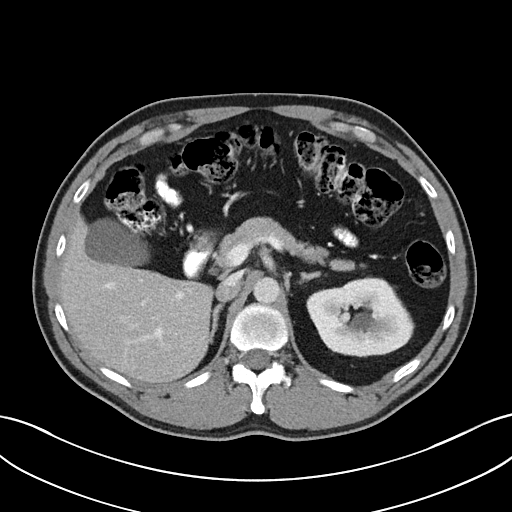
[im 85/96  soft-tissue]
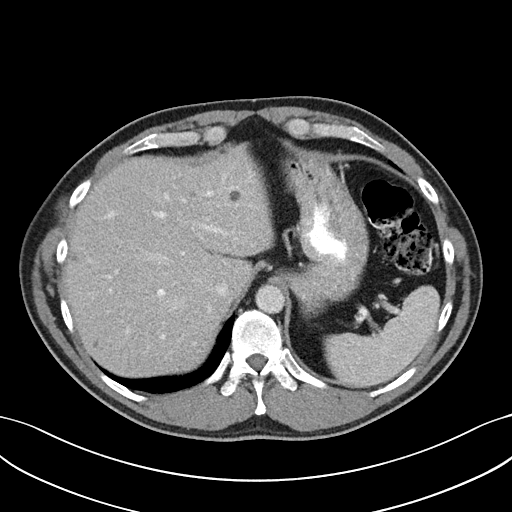
[im 90/96  soft-tissue]
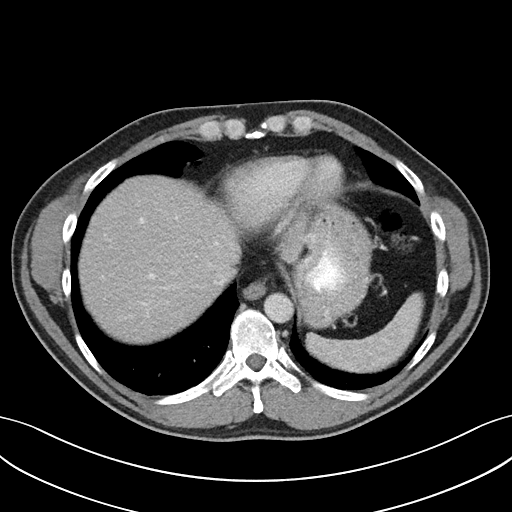

[Series 5: abd/pel w st · coronal · 0.76mm/px · 3 of 85 slices shown]
[im 29/85  soft-tissue]
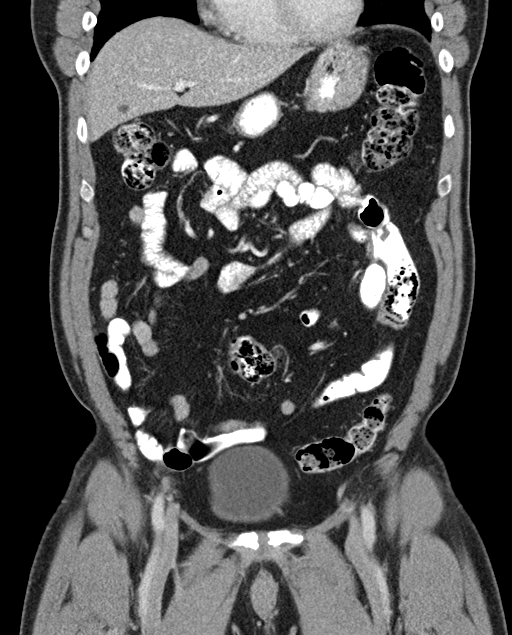
[im 38/85  soft-tissue]
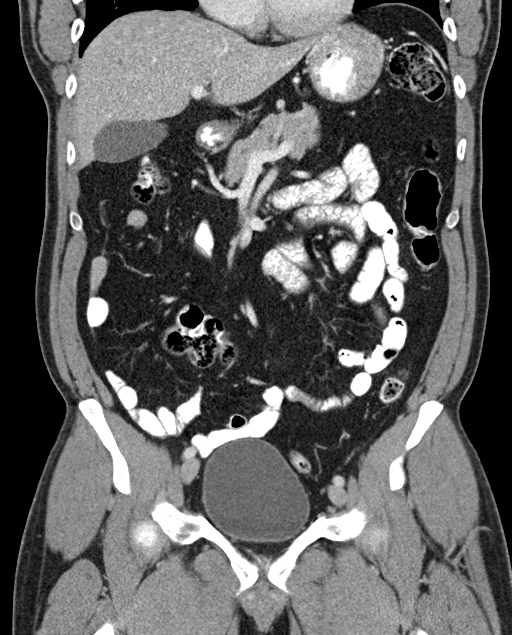
[im 47/85  soft-tissue]
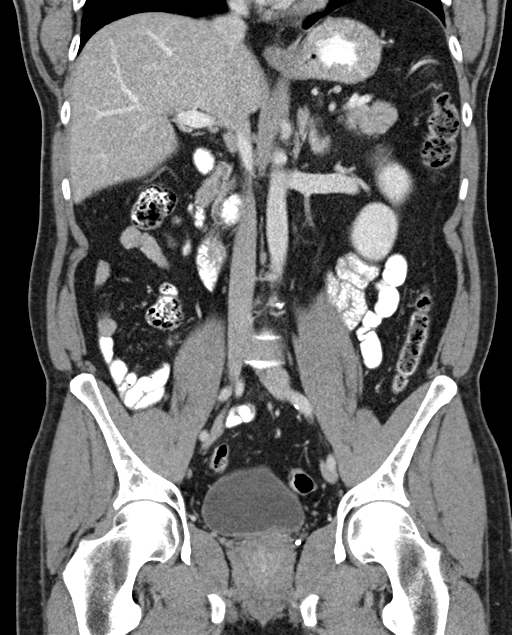

[16 of 46 positions shown; findings below may reference images not displayed]

FINDINGS: Lower chest: No acute abnormality.

Hepatobiliary: Multiple indeterminate subcentimeter hypodensities
are too small to characterize. No gallstones, gallbladder wall
thickening, or pericholecystic fluid. No biliary ductal dilatation.

Pancreas: No focal lesion. Normal pancreatic contour. No surrounding
inflammatory changes. No main pancreatic ductal dilatation.

Spleen: Normal in size. There is an indeterminate 1.2 cm fluid
density lesion within the splenic parenchyma ([DATE]).

Adrenals/Urinary Tract: No adrenal nodule bilaterally. Likely
congenitally unilateral kidney with right kidney not identified. The
left kidney enhances homogeneously. The left kidney is hypertrophied
in size. No hydronephrosis. No hydroureter. The urinary bladder is
unremarkable.

Stomach/Bowel: PO contrast reaches the sigmoid colon. Stomach is
within normal limits. No evidence of bowel wall thickening or
dilatation. Few scattered sigmoid diverticula. Mobile cecum located
within the left mid abdomen. Appendix appears normal.

Vascular/Lymphatic: No abdominal aorta or iliac aneurysm. Mild
atherosclerotic plaque of the aorta and its branches. No abdominal,
pelvic, or inguinal lymphadenopathy.

Reproductive: Prostate is unremarkable.

Other: No intraperitoneal free fluid. No intraperitoneal free gas.
No organized fluid collection.

Musculoskeletal:

No abdominal wall hernia or abnormality

No suspicious lytic or blastic osseous lesions. No acute displaced
fracture.
IMPRESSION: 1. No acute intra-abdominal or intra-pelvic abnormality.
2. Scattered sigmoid diverticulosis with no acute diverticulitis.
3. Multiple indeterminate hypodense hepatic lesions as well as a
cm fluid density splenic lesion. Recommend correlation with prior
cross-sectional imaging.
4. Mobile cecum.
5. Unilateral left kidney.

## 2021-07-30 ENCOUNTER — Telehealth: Payer: BC Managed Care – PPO | Admitting: Medical

## 2021-07-30 ENCOUNTER — Other Ambulatory Visit: Payer: Self-pay

## 2021-07-30 VITALS — Temp 100.5°F | Wt 174.0 lb

## 2021-07-30 DIAGNOSIS — R509 Fever, unspecified: Secondary | ICD-10-CM

## 2021-07-30 DIAGNOSIS — R6889 Other general symptoms and signs: Secondary | ICD-10-CM

## 2021-07-30 MED ORDER — OSELTAMIVIR PHOSPHATE 75 MG PO CAPS: 75.0000 mg | ORAL_CAPSULE | Freq: Two times a day (BID) | ORAL | 0 refills | Status: DC

## 2021-07-30 NOTE — Progress Notes (Signed)
?Subjective:  ?  ? Patient ID: Chase Rogers, male   DOB: 1970-08-13, 51 y.o.   MRN: [id number] ? ?This visit type was conducted due to national recommendations for restrictions regarding the COVID-19 Pandemic (e.g. social distancing) in an effort to limit this patient's exposure and mitigate transmission in our community.  Due to their co-morbid illnesses, this patient is at least at moderate risk for complications without adequate follow up.  This format is felt to be most appropriate for this patient at this time.   ? ?Documentation for virtual audio and video telecommunications through [facility] encounter: ? ?The patient was located at home. ?The provider was located in the office. ?The patient did consent to this visit and is aware of possible charges through their insurance for this visit. ? ?The other persons participating in this telemedicine service were none. ?Time spent on call was 20 minutes and in review of previous records 20 minutes total. ? ?This virtual service is not related to other E/M service within previous 7 days. ? ? ?HPI ?Chief Complaint  ?Patient presents with  ? possible flu  ?  Symptoms- congestion, cough, body aches. Chills, fever 100.5, runny nose. Started sunday  ? ?Virtual consult for illness.  Has had some allergy symptoms for the past several days or more but had abrupt change in symptoms over the last few days with fever, body aches, sweats, achy all over, congested in the chest and head, but no sore throat, no nausea or vomiting, no diarrhea.  His husband Chase Rogers had symptoms similar prior to him getting sick.  He did a home COVID test 2 days ago that was negative.  No other aggravating or relieving factors. No other complaint. ? ? ?Past Medical History:  ?Diagnosis Date  ? Allergy   ? Anxiety   ? Congenital single kidney   ? per prior ultrasound and other imaging  ? Depression   ? GERD (gastroesophageal reflux disease)   ? High triglycerides   ? History of EKG 2016  ? normal per  report  ? Hyperlipidemia   ? Hypertension 2004  ? Migraine   ? Renal agenesis   ? Wart 2014  ? finger  ? Wears contact lenses   ? ?Current Outpatient Medications on File Prior to Visit  ?Medication Sig Dispense Refill  ? buPROPion (WELLBUTRIN SR) 150 MG 12 hr tablet TAKE 1 TABLET BY MOUTH TWICE A DAY 180 tablet 1  ? esomeprazole (NEXIUM) 20 MG capsule Take 20 mg by mouth daily at 12 noon.    ? icosapent Ethyl (VASCEPA) 1 g capsule TAKE 2 CAPSULES TWICE A DAY 360 capsule 3  ? lisinopril (ZESTRIL) 5 MG tablet TAKE 1 TABLET BY MOUTH EVERY DAY 90 tablet 0  ? Multiple Vitamin (MULTIVITAMIN) tablet Take 1 tablet by mouth daily.    ? rosuvastatin (CRESTOR) 20 MG tablet Take 1 tablet (20 mg total) by mouth daily. 90 tablet 0  ? ?No current facility-administered medications on file prior to visit.  ? ? ? ?Review of Systems ?As in subjective ?   ?Objective:  ? Physical Exam ?Due to coronavirus pandemic stay at home measures, patient visit was virtual and they were not examined in person.   ?Temp (!) 100.5 ?F (38.1 ?C)   Wt 174 lb (78.9 kg)   BMI 24.27 kg/m?  ? ?General: Well-developed well-nourished no acute stress, ill-appearing somewhat ?No labored breathing or wheezing ? ?   ?Assessment:  ?   ?Encounter Diagnoses  ?Name Primary?  ?  Flu-like symptoms Yes  ? Fever, unspecified fever cause   ? ? ?   ?Plan:  ?   ?We discussed symptoms and concerns.  He had a negative home COVID test.  Symptoms suggest flulike illness.  Begin medication Tamiflu as below.  Advise rest, hydration, Tylenol for fever every 4-6 hours as needed.  Discussed supportive measures.  Can use the Mucinex DM over-the-counter he is using or other congestion remedy as discussed.  I advise he check her blood pressure and pulse at home.  He has a meter but does not have a reading right now.  Advise a pulse over 100 or blood pressure less than normal such as less than 110/65 to let me know as we would consider doing a chest x-ray or seeing him in person ? ?For  now he will use supportive measures.  If worsening in the next few days particularly with shortness of breath or wheezing or blood-tinged mucus then call or recheck ? ?Chase Rogers was seen today for possible flu. ? ?Diagnoses and all orders for this visit: ? ?Flu-like symptoms ? ?Fever, unspecified fever cause ? ?Other orders ?-     oseltamivir (TAMIFLU) 75 MG capsule; Take 1 capsule (75 mg total) by mouth 2 (two) times daily. ? ?Follow-up as needed ?

## 2021-08-02 ENCOUNTER — Other Ambulatory Visit: Payer: Self-pay | Admitting: Medical

## 2021-08-03 ENCOUNTER — Other Ambulatory Visit: Payer: Self-pay | Admitting: Medical

## 2021-08-04 ENCOUNTER — Ambulatory Visit
Admission: RE | Admit: 2021-08-04 | Discharge: 2021-08-04 | Disposition: A | Discharge status: Home / Self Care | Payer: BC Managed Care – PPO | Source: Ambulatory Visit | Attending: Medical | Admitting: Medical

## 2021-08-04 ENCOUNTER — Other Ambulatory Visit: Payer: Self-pay | Admitting: Medical

## 2021-08-04 ENCOUNTER — Ambulatory Visit: Payer: BC Managed Care – PPO | Admitting: Medical

## 2021-08-04 VITALS — BP 140/90 | HR 103 | Temp 99.6°F | Resp 18 | Wt 176.4 lb

## 2021-08-04 DIAGNOSIS — R051 Acute cough: Secondary | ICD-10-CM

## 2021-08-04 DIAGNOSIS — R000 Tachycardia, unspecified: Secondary | ICD-10-CM | POA: Diagnosis not present

## 2021-08-04 DIAGNOSIS — R059 Cough, unspecified: Secondary | ICD-10-CM

## 2021-08-04 DIAGNOSIS — J069 Acute upper respiratory infection, unspecified: Secondary | ICD-10-CM

## 2021-08-04 DIAGNOSIS — R0602 Shortness of breath: Secondary | ICD-10-CM

## 2021-08-04 MED ORDER — ALBUTEROL SULFATE HFA 108 (90 BASE) MCG/ACT IN AERS: 2.0000 | INHALATION_SPRAY | Freq: Four times a day (QID) | RESPIRATORY_TRACT | 0 refills | Status: DC | PRN

## 2021-08-04 MED ORDER — METHYLPREDNISOLONE ACETATE 80 MG/ML IJ SUSP
80.0000 mg | Freq: Once | INTRAMUSCULAR | Status: AC
  Administered 2021-08-04: 80 mg via INTRAMUSCULAR

## 2021-08-04 MED ORDER — FLUTICASONE FUROATE-VILANTEROL 100-25 MCG/ACT IN AEPB: 1.0000 | INHALATION_SPRAY | Freq: Every day | RESPIRATORY_TRACT | 0 refills | Status: DC

## 2021-08-04 NOTE — Progress Notes (Signed)
?Subjective:  ?  ? Patient ID: Chase Rogers, male   DOB: 1971-03-16, 51 y.o.   MRN: [id number] ? ?HPI ?Chief Complaint  ?Patient presents with  ? trouble breathing  ?  Feels like hes running a race. Not having chest pain. Had flu last week  ? ?Here for follow-up from virtual consult from last week on July 30, 2021.  He came in due to shortness of breath and cough.  When we spoke last week he had symptoms including fever, body aches, sweats, achy all over, congested in the chest and head, but no sore throat, no nausea or vomiting, no diarrhea.  His husband TJ had symptoms similar prior to him getting sick.  He had done a COVID home test that was negative.  We treated empirically for flu with Tamiflu. ? ?Currently he notes difficult to catch breath, tried to work yesterday and fever returned.  Not so much body aches but has chills.  Today when he got up to get ready for work, by the time he got out of shower couldn't catch breath.  ? ?No recent long travel, injury, or surgery.  No hx/o DVT or PE ? ?In general runs on treadmill several days per week without c/o. This current wheezing is horrible.  ? ?No other aggravating or relieving factors. No other complaint. ? ? ?Past Medical History:  ?Diagnosis Date  ? Allergy   ? Anxiety   ? Congenital single kidney   ? per prior ultrasound and other imaging  ? Depression   ? GERD (gastroesophageal reflux disease)   ? High triglycerides   ? History of EKG 2016  ? normal per report  ? Hyperlipidemia   ? Hypertension 2004  ? Migraine   ? Renal agenesis   ? Wart 2014  ? finger  ? Wears contact lenses   ? ?Current Outpatient Medications on File Prior to Visit  ?Medication Sig Dispense Refill  ? buPROPion (WELLBUTRIN SR) 150 MG 12 hr tablet TAKE 1 TABLET BY MOUTH TWICE A DAY 180 tablet 1  ? esomeprazole (NEXIUM) 20 MG capsule Take 20 mg by mouth daily at 12 noon.    ? icosapent Ethyl (VASCEPA) 1 g capsule TAKE 2 CAPSULES TWICE A DAY 360 capsule 3  ? lisinopril (ZESTRIL) 5 MG  tablet TAKE 1 TABLET BY MOUTH EVERY DAY 90 tablet 0  ? Multiple Vitamin (MULTIVITAMIN) tablet Take 1 tablet by mouth daily.    ? rosuvastatin (CRESTOR) 20 MG tablet TAKE 1 TABLET BY MOUTH EVERY DAY 90 tablet 0  ? ?No current facility-administered medications on file prior to visit.  ? ?Review of Systems ?As in subjective ?   ?Objective:  ? Physical Exam  ?BP 140/90   Pulse (!) 103   Temp 99.6 ?F (37.6 ?C)   Resp 18   Wt 176 lb 6.4 oz (80 kg)   SpO2 96%   BMI 24.60 kg/m?  ? ?General: Well-developed well-nourished , flushed appearing ?HEENT: normocephalic, sclerae anicteric, nares patent, no discharge or erythema, pharynx normal ?Oral cavity: MMM, no lesions ?Neck: supple, no lymphadenopathy, no thyromegaly, no masses, no JVD ?Heart: RRR, normal S1, S2, no murmurs ?Lungs: wheezes throughout, no rhonchi, or rales ?no ext edema, no calve tendnerss or asymmetry, neg homans ?Pulses: 2+ symmetric, upper and lower extremities, normal cap refill ? ?EKG ?Indication shortness of breath, rate 92 bpm, PR 152 ms, QRS 86 ms, QTc 437 ms, axis 55 degrees, normal sinus rhythm, nonspecific ST wave abnormality compared to 2020 EKG ? ?   ?  Assessment:  ?   ?Encounter Diagnoses  ?Name Primary?  ? SOB (shortness of breath) Yes  ? Recent upper respiratory tract infection   ? Cough, unspecified type   ? Tachycardia   ? ? ?   ?Plan:  ?   ?Symptoms suggest inflammation from recent respiratory illness.  Your chest x-ray was normal today ? ?We gave you a round of albuterol nebulized treatment in the office.  We also gave a round of steroid injection called Depo-Medrol in the office today to help with inflammation and wheezing ? ?Begin Breo inhaler sample which is a maintenance inhaler, 1 inhalation daily.  Rinse mouth out with water after use ?REST ?Hydrate well with fluids and water throughout the day ?You can use albuterol rescue inhaler 2 puffs every 4-6 hours for shortness of breath, wheezing or cough fits ?We will call with lab  results ?I would expect some improvements over the next 48 hours.  If not improving or much worse in the next 48 hours , particularly with chest pain or palpitations go to the hospital or call 911 ? ?Reviewed case with Dr. Redmond School supervising as well ? ? ?Chase Rogers was seen today for trouble breathing. ? ?Diagnoses and all orders for this visit: ? ?SOB (shortness of breath) ?-     CBC with Differential/Platelet ?-     D-dimer, quantitative ?-     Basic metabolic panel ?-     EKG 12-Lead ? ?Recent upper respiratory tract infection ?-     CBC with Differential/Platelet ?-     D-dimer, quantitative ?-     Basic metabolic panel ?-     EKG 12-Lead ? ?Cough, unspecified type ?-     CBC with Differential/Platelet ?-     D-dimer, quantitative ?-     Basic metabolic panel ?-     EKG 12-Lead ? ?Tachycardia ?-     CBC with Differential/Platelet ?-     D-dimer, quantitative ?-     Basic metabolic panel ?-     EKG 12-Lead ? ?Other orders ?-     fluticasone furoate-vilanterol (BREO ELLIPTA) 100-25 MCG/ACT AEPB; Inhale 1 puff into the lungs daily. ?-     albuterol (VENTOLIN HFA) 108 (90 Base) MCG/ACT inhaler; Inhale 2 puffs into the lungs every 6 (six) hours as needed for wheezing or shortness of breath. ? ? ?Follow-up with call report within 48 hours ?

## 2021-08-04 NOTE — Patient Instructions (Signed)
Symptoms suggest inflammation from recent respiratory illness.  Your chest x-ray was normal today ? ?We gave you a round of albuterol nebulized treatment in the office.  We also gave a round of steroid injection called Depo-Medrol in the office today to help with inflammation and wheezing ? ?Begin Breo inhaler sample which is a maintenance inhaler, 1 inhalation daily.  Rinse mouth out with water after use ?REST ?Hydrate well with fluids and water throughout the day ?You can use albuterol rescue inhaler 2 puffs every 4-6 hours for shortness of breath, wheezing or cough fits ?We will call with lab results ?I would expect some improvements over the next 48 hours.  If not improving or much worse in the next 48 hours , particularly with chest pain or palpitations go to the hospital or call 911 ?

## 2021-08-05 ENCOUNTER — Encounter: Payer: Self-pay | Admitting: Medical

## 2021-08-05 ENCOUNTER — Other Ambulatory Visit: Payer: Self-pay | Admitting: Medical

## 2021-08-05 LAB — CBC WITH DIFFERENTIAL/PLATELET
Basophils Absolute: 0 10*3/uL (ref 0.0–0.2)
Basos: 0 %
EOS (ABSOLUTE): 0.1 10*3/uL (ref 0.0–0.4)
Eos: 0 %
Hematocrit: 45.9 % (ref 37.5–51.0)
Hemoglobin: 16 g/dL (ref 13.0–17.7)
Immature Grans (Abs): 0 10*3/uL (ref 0.0–0.1)
Immature Granulocytes: 0 %
Lymphocytes Absolute: 2.4 10*3/uL (ref 0.7–3.1)
Lymphs: 19 %
MCH: 30.2 pg (ref 26.6–33.0)
MCHC: 34.9 g/dL (ref 31.5–35.7)
MCV: 87 fL (ref 79–97)
Monocytes Absolute: 1 10*3/uL — ABNORMAL HIGH (ref 0.1–0.9)
Monocytes: 8 %
Neutrophils Absolute: 8.7 10*3/uL — ABNORMAL HIGH (ref 1.4–7.0)
Neutrophils: 73 %
Platelets: 391 10*3/uL (ref 150–450)
RBC: 5.29 x10E6/uL (ref 4.14–5.80)
RDW: 12.8 % (ref 11.6–15.4)
WBC: 12.2 10*3/uL — ABNORMAL HIGH (ref 3.4–10.8)

## 2021-08-05 LAB — BASIC METABOLIC PANEL WITH GFR
BUN/Creatinine Ratio: 12 (ref 9–20)
BUN: 14 mg/dL (ref 6–24)
CO2: 21 mmol/L (ref 20–29)
Calcium: 10.1 mg/dL (ref 8.7–10.2)
Chloride: 102 mmol/L (ref 96–106)
Creatinine, Ser: 1.13 mg/dL (ref 0.76–1.27)
Glucose: 99 mg/dL (ref 70–99)
Potassium: 4.3 mmol/L (ref 3.5–5.2)
Sodium: 142 mmol/L (ref 134–144)
eGFR: 79 mL/min/{1.73_m2}

## 2021-08-05 LAB — D-DIMER, QUANTITATIVE: D-DIMER: 0.2 mg/L FEU (ref 0.00–0.49)

## 2021-08-05 MED ORDER — PREDNISONE 10 MG PO TABS: ORAL_TABLET | ORAL | 0 refills | Status: DC

## 2021-08-21 ENCOUNTER — Ambulatory Visit: Payer: BC Managed Care – PPO | Admitting: Medical

## 2021-08-21 VITALS — BP 124/80 | HR 95 | Wt 170.8 lb

## 2021-08-21 DIAGNOSIS — Z131 Encounter for screening for diabetes mellitus: Secondary | ICD-10-CM | POA: Diagnosis not present

## 2021-08-21 DIAGNOSIS — R972 Elevated prostate specific antigen [PSA]: Secondary | ICD-10-CM

## 2021-08-21 DIAGNOSIS — R0602 Shortness of breath: Secondary | ICD-10-CM

## 2021-08-21 DIAGNOSIS — R062 Wheezing: Secondary | ICD-10-CM

## 2021-08-21 DIAGNOSIS — R7301 Impaired fasting glucose: Secondary | ICD-10-CM | POA: Diagnosis not present

## 2021-08-21 DIAGNOSIS — I10 Essential (primary) hypertension: Secondary | ICD-10-CM | POA: Diagnosis not present

## 2021-08-21 DIAGNOSIS — J019 Acute sinusitis, unspecified: Secondary | ICD-10-CM

## 2021-08-21 MED ORDER — AMOXICILLIN 875 MG PO TABS: 875.0000 mg | ORAL_TABLET | Freq: Two times a day (BID) | ORAL | 0 refills | Status: AC

## 2021-08-21 NOTE — Progress Notes (Signed)
?Subjective:  ?  ? Patient ID: Chase Rogers, male   DOB: [birth year], 51 y.o.   MRN: [id number] ? ?HPI ?Chief Complaint  ?Patient presents with  ? fasting med check  ?  Fasting med check. No concerns  ? ?Here for f/u.  I saw him 08/04/21 for shortness of breath.   We iniitaly did consult virtually July 30, 2021.  When we initially spoke, he had symptoms including fever, body aches, sweats, achy all over, congested in the chest and head, but no sore throat, no nausea or vomiting, no diarrhea.  He had done a COVID home test that was negative.  We treated empirically for flu with Tamiflu initially 07/30/21.   Then on 08/04/21 he had SOB, difficult to catch breath.  No recent long travel, injury, or surgery.  No hx/o DVT or PE ? ?Last visit we began Breo sample inhaler and albuterol prn.   ? ?This week started back his usual exercise on treadmill.   Currently dealing with head congestion, possible allergy problem but breathing is improved.  Breathing close to normal now.   He had never been on inhales prior to last visit.  Hasn't used albuterol in the last 3 days. Finished the 2 week sample of Breo.  ? ?Currently has post nasal drainage, cough from drainage, blowing nose, yellowish mucous.  Not much sneezing.   No itchy and watery eyes, but generally doesn't get eye samples in general.  Currently upper teeth ache. ? ?Using nasal saline, did some sudafed a few days this week, astepro nasal ? ?No other aggravating or relieving factors. No other complaint. ? ? ?Past Medical History:  ?Diagnosis Date  ? Allergy   ? Anxiety   ? Congenital single kidney   ? per prior ultrasound and other imaging  ? Depression   ? GERD (gastroesophageal reflux disease)   ? High triglycerides   ? History of EKG 2016  ? normal per report  ? Hyperlipidemia   ? Hypertension 2004  ? Migraine   ? Renal agenesis   ? Wart 2014  ? finger  ? Wears contact lenses   ? ?Current Outpatient Medications on File Prior to Visit  ?Medication Sig Dispense Refill   ? albuterol (VENTOLIN HFA) 108 (90 Base) MCG/ACT inhaler Inhale 2 puffs into the lungs every 6 (six) hours as needed for wheezing or shortness of breath. 8 g 0  ? buPROPion (WELLBUTRIN SR) 150 MG 12 hr tablet TAKE 1 TABLET BY MOUTH TWICE A DAY 180 tablet 1  ? esomeprazole (NEXIUM) 20 MG capsule Take 20 mg by mouth daily at 12 noon.    ? icosapent Ethyl (VASCEPA) 1 g capsule TAKE 2 CAPSULES TWICE A DAY 360 capsule 3  ? lisinopril (ZESTRIL) 5 MG tablet TAKE 1 TABLET BY MOUTH EVERY DAY 90 tablet 0  ? Multiple Vitamin (MULTIVITAMIN) tablet Take 1 tablet by mouth daily.    ? rosuvastatin (CRESTOR) 20 MG tablet TAKE 1 TABLET BY MOUTH EVERY DAY 90 tablet 0  ? ?No current facility-administered medications on file prior to visit.  ? ?Review of Systems ?As in subjective ?   ?Objective:  ? Physical Exam  ?BP 124/80   Pulse 95   Wt 170 lb 12.8 oz (77.5 kg)   SpO2 98%   BMI 23.82 kg/m?  ? ?General: Well-developed well-nourished, nad ?Congested sounding ?HEENT: normocephalic, sclerae anicteric, nares with clear to purulent discharge , turbinated edema and + erythema, pharynx normal ?Oral cavity: MMM, no lesions ?Neck: supple,  no lymphadenopathy, no thyromegaly, no masses, no JVD ?Heart: RRR, normal S1, S2, no murmurs ?Lungs: clear, no rhonchi, or rales ?no ext edema, no calve tendnerss or asymmetry, neg homans ?Pulses: 2+ symmetric, upper and lower extremities, normal cap refill ? ? ?   ?Assessment:  ?   ?Encounter Diagnoses  ?Name Primary?  ? Elevated blood pressure reading with diagnosis of hypertension Yes  ? Impaired fasting blood sugar   ? Abnormal PSA   ? Screening for diabetes mellitus   ? Wheezing   ? Acute non-recurrent sinusitis, unspecified location   ? SOB (shortness of breath)   ? ? ?   ?Plan:  ?   ? ?From his recent visit he is much improved in terms of breathing.  He got much improvement on Breo inhaler and albuterol.  He did a 2-week Breo sample.  He last used albuterol 3 days ago.  Feels much improved.  He  currently has symptoms of sinusitis.  Begin amoxicillin, continue nasal saline flush.  Use Mucinex or Coricidin HBP next few days.  If this is not improving let us know ? ?Back in December he had elevated glucose in an increase in PSA from prior on his labs for his physical. ? ?We are rechecking PSA and glucose labs today. ? ?Counseled on impaired glucose, risk for diabetes.  Counseled on elevated PSA or changing PSA greater than 50% from the prior year. ? ? ?Patient Instructions  ?Recommendations: ?Avoid sudafed and "decongestants" ?Instead use Mucinex or Coricidin HBP for mucus ?You can continue Astepro and nasal saline flush ?Drink plenty fluids throughout the day ?Continue your current blood pressure medication ?Monitor your blood pressures 3 times per week.  As long as your blood pressures are getting back to normal over the next month then fine. ?If your blood pressures are staying elevated within the next 3 to 4 weeks we might need to modify her regimen ?If you are staying really high numbers over the next few weeks such as 160/90 or higher, let me know ASAP as we would modify BP medication and likely refer to cardiology ? ? ? ?Chase Rogers was seen today for fasting med check. ? ?Diagnoses and all orders for this visit: ? ?Elevated blood pressure reading with diagnosis of hypertension ? ?Impaired fasting blood sugar ?-     Hemoglobin A1c ?-     Glucose (CBG) ? ?Abnormal PSA ?-     PSA, total and free ? ?Screening for diabetes mellitus ?-     Hemoglobin A1c ?-     Glucose (CBG) ? ?Wheezing ? ?Acute non-recurrent sinusitis, unspecified location ? ?SOB (shortness of breath) ? ?Other orders ?-     amoxicillin (AMOXIL) 875 MG tablet; Take 1 tablet (875 mg total) by mouth 2 (two) times daily for 10 days. ? ? ?Follow-up pending labs ?

## 2021-08-21 NOTE — Patient Instructions (Signed)
Recommendations: ?Avoid sudafed and "decongestants" ?Instead use Mucinex or Coricidin HBP for mucus ?You can continue Astepro and nasal saline flush ?Drink plenty fluids throughout the day ?Continue your current blood pressure medication ?Monitor your blood pressures 3 times per week.  As long as your blood pressures are getting back to normal over the next month then fine. ?If your blood pressures are staying elevated within the next 3 to 4 weeks we might need to modify her regimen ?If you are staying really high numbers over the next few weeks such as 160/90 or higher, let me know ASAP as we would modify BP medication and likely refer to cardiology ? ? ? ?

## 2021-08-22 LAB — HEMOGLOBIN A1C
Est. average glucose Bld gHb Est-mCnc: 117 mg/dL
Hgb A1c MFr Bld: 5.7 % — ABNORMAL HIGH (ref 4.8–5.6)

## 2021-08-22 LAB — PSA, TOTAL AND FREE
PSA, Free Pct: 28 %
PSA, Free: 0.14 ng/mL
Prostate Specific Ag, Serum: 0.5 ng/mL (ref 0.0–4.0)

## 2021-08-25 LAB — GLUCOSE, RANDOM: Glucose: 94 mg/dL (ref 70–99)

## 2021-08-31 ENCOUNTER — Other Ambulatory Visit: Payer: Self-pay | Admitting: Medical

## 2021-10-16 ENCOUNTER — Other Ambulatory Visit: Payer: Self-pay | Admitting: Medical

## 2021-10-29 ENCOUNTER — Other Ambulatory Visit: Payer: Self-pay | Admitting: Medical

## 2021-11-23 ENCOUNTER — Ambulatory Visit: Payer: BC Managed Care – PPO | Admitting: Physician Assistant

## 2021-11-23 ENCOUNTER — Other Ambulatory Visit: Payer: Self-pay | Admitting: Physician Assistant

## 2021-11-23 ENCOUNTER — Encounter: Payer: Self-pay | Admitting: Physician Assistant

## 2021-11-23 VITALS — BP 130/80 | HR 83 | Ht 71.0 in | Wt 177.8 lb

## 2021-11-23 DIAGNOSIS — H00014 Hordeolum externum left upper eyelid: Secondary | ICD-10-CM | POA: Diagnosis not present

## 2021-11-23 DIAGNOSIS — H00034 Abscess of left upper eyelid: Secondary | ICD-10-CM | POA: Diagnosis not present

## 2021-11-23 MED ORDER — NEOMYCIN-POLYMYXIN-HC 3.5-10000-1 OP SUSP: 3.0000 [drp] | Freq: Three times a day (TID) | OPHTHALMIC | 0 refills | Status: DC

## 2021-11-23 MED ORDER — OFLOXACIN 0.3 % OP SOLN: 1.0000 [drp] | Freq: Four times a day (QID) | OPHTHALMIC | 0 refills | Status: AC

## 2021-11-23 MED ORDER — CEFDINIR 300 MG PO CAPS: 300.0000 mg | ORAL_CAPSULE | Freq: Two times a day (BID) | ORAL | 0 refills | Status: DC

## 2021-11-23 NOTE — Progress Notes (Signed)
Acute Office Visit  Subjective:    Patient ID: Chase Rogers, male    DOB: 17-Apr-1971, 51 y.o.   MRN: [id number]  Chief Complaint  Patient presents with   Acute Visit    Possible stye on his left eye that's been there for about 2 weeks. It is no longer painful but it wont go away.    HPI Patient is in today for left upper lid stye x 2 weeks; denies trauma or injury; denies sick contacts; denies recent swimming or travel; denies any new irritants for self or pets; vision is fine and wearing contact lenses doesn't irritate it; denies crusting of eyes in the morning; warm compresses, OTC stye medicine haven't been helpful; eyelid was painful in the beginning, but now is just a little irritated.  Outpatient Medications Prior to Visit  Medication Sig Dispense Refill   albuterol (VENTOLIN HFA) 108 (90 Base) MCG/ACT inhaler TAKE 2 PUFFS BY MOUTH EVERY 6 HOURS AS NEEDED FOR WHEEZE OR SHORTNESS OF BREATH 8.5 each 0   buPROPion (WELLBUTRIN SR) 150 MG 12 hr tablet TAKE 1 TABLET BY MOUTH TWICE A DAY 180 tablet 1   esomeprazole (NEXIUM) 20 MG capsule Take 20 mg by mouth daily at 12 noon.     icosapent Ethyl (VASCEPA) 1 g capsule TAKE 2 CAPSULES TWICE A DAY 360 capsule 3   lisinopril (ZESTRIL) 5 MG tablet TAKE 1 TABLET BY MOUTH EVERY DAY 90 tablet 1   Multiple Vitamin (MULTIVITAMIN) tablet Take 1 tablet by mouth daily.     rosuvastatin (CRESTOR) 20 MG tablet TAKE 1 TABLET BY MOUTH EVERY DAY 90 tablet 1   No facility-administered medications prior to visit.    Allergies  Allergen Reactions   Erythromycin     Age 73, unsure of allergy    Review of Systems  Constitutional:  Negative for activity change, chills, fatigue and fever.  HENT:  Negative for congestion, ear pain, hearing loss and voice change.   Eyes:  Positive for pain. Negative for photophobia, discharge, redness, itching and visual disturbance.  Respiratory:  Negative for cough and shortness of breath.   Cardiovascular:   Negative for leg swelling.  Gastrointestinal:  Negative for constipation, diarrhea, nausea and vomiting.  Endocrine: Negative for polyuria.  Genitourinary:  Negative for flank pain and frequency.  Musculoskeletal:  Negative for joint swelling and neck pain.  Skin:  Negative for rash.  Neurological:  Negative for dizziness.  Hematological:  Does not bruise/bleed easily.  Psychiatric/Behavioral:  Negative for agitation and behavioral problems.        Objective:    Physical Exam Vitals and nursing note reviewed.  Constitutional:      General: He is not in acute distress.    Appearance: Normal appearance.  HENT:     Head: Normocephalic and atraumatic.     Right Ear: External ear normal.     Left Ear: External ear normal.     Nose: No congestion.  Eyes:     General: Vision grossly intact.        Left eye: Hordeolum present.    Extraocular Movements: Extraocular movements intact.     Right eye: Normal extraocular motion and no nystagmus.     Left eye: Normal extraocular motion and no nystagmus.     Conjunctiva/sclera: Conjunctivae normal.     Right eye: Right conjunctiva is not injected.     Left eye: Left conjunctiva is not injected.     Pupils: Pupils are equal, round, and reactive  to light.      Comments: Erythematous left upper lid with papule  Cardiovascular:     Rate and Rhythm: Normal rate and regular rhythm.     Pulses: Normal pulses.     Heart sounds: Normal heart sounds.  Pulmonary:     Effort: Pulmonary effort is normal.     Breath sounds: Normal breath sounds. No wheezing.  Abdominal:     General: Bowel sounds are normal.     Palpations: Abdomen is soft.  Musculoskeletal:        General: Normal range of motion.     Cervical back: Normal range of motion and neck supple.     Right lower leg: No edema.     Left lower leg: No edema.  Skin:    General: Skin is warm and dry.     Findings: No rash.  Neurological:     Mental Status: He is alert and oriented to  person, place, and time.     Gait: Gait normal.  Psychiatric:        Mood and Affect: Mood normal.        Behavior: Behavior normal.     BP 130/80   Pulse 83   Ht '5\' 11"'$  (1.803 m)   Wt 177 lb 12.8 oz (80.6 kg)   SpO2 95%   BMI 24.80 kg/m   Wt Readings from Last 3 Encounters:  11/23/21 177 lb 12.8 oz (80.6 kg)  08/21/21 170 lb 12.8 oz (77.5 kg)  08/04/21 176 lb 6.4 oz (80 kg)    Results for orders placed or performed in visit on 08/21/21  PSA, total and free  Result Value Ref Range   Prostate Specific Ag, Serum 0.5 0.0 - 4.0 ng/mL   PSA, Free 0.14 N/A ng/mL   PSA, Free Pct 28.0 %  Hemoglobin A1c  Result Value Ref Range   Hgb A1c MFr Bld 5.7 (H) 4.8 - 5.6 %   Est. average glucose Bld gHb Est-mCnc 117 mg/dL  Glucose, Random  Result Value Ref Range   Glucose 94 70 - 99 mg/dL       Assessment & Plan:  1. Cellulitis of left upper eyelid  2. Hordeolum externum of left upper eyelid  1 & 2 - continue warm compresses, cefdinir 300 mg bid x 10 days and ofloxacin ophth drops, If symptoms get worse, please call the office for a follow up appointment if available, otherwise go to [facility], Urgent Care, call 911 / EMS, or go to the Emergency Department for further evaluation.    Meds ordered this encounter  Medications   cefdinir (OMNICEF) 300 MG capsule    Sig: Take 1 capsule (300 mg total) by mouth 2 (two) times daily.    Dispense:  20 capsule    Refill:  0    Order Specific Question:   Supervising Provider    Answer:   Denita Lung [[id number]]   DISCONTD: neomycin-polymyxin-hydrocortisone (CORTISPORIN) 3.5-10000-1 ophthalmic suspension    Sig: Place 3 drops into the left eye 3 (three) times daily for 5 days.    Dispense:  7.5 mL    Refill:  0    Order Specific Question:   Supervising Provider    Answer:   Denita Lung [[id number]]   ofloxacin (OCUFLOX) 0.3 % ophthalmic solution    Sig: Place 1 drop into the left eye 4 (four) times daily for 5 days.    Dispense:  5 mL     Refill:  0    Order Specific Question:   Supervising Provider    Answer:   Denita Lung [[id number]]    Return for Return as Already Scheduled.  Irene Pap, PA-C

## 2021-11-23 NOTE — Telephone Encounter (Signed)
Medication out of stock. Pharmacy is requesting alternative

## 2021-11-23 NOTE — Telephone Encounter (Signed)
Ofloxacin eye drops ordered instead

## 2021-12-08 ENCOUNTER — Ambulatory Visit: Payer: BC Managed Care – PPO | Admitting: Medical

## 2021-12-08 ENCOUNTER — Encounter: Payer: Self-pay | Admitting: Medical

## 2021-12-08 VITALS — BP 120/80 | HR 78 | Wt 179.6 lb

## 2021-12-08 DIAGNOSIS — R519 Headache, unspecified: Secondary | ICD-10-CM

## 2021-12-08 DIAGNOSIS — Z8669 Personal history of other diseases of the nervous system and sense organs: Secondary | ICD-10-CM | POA: Diagnosis not present

## 2021-12-08 DIAGNOSIS — R040 Epistaxis: Secondary | ICD-10-CM | POA: Diagnosis not present

## 2021-12-08 MED ORDER — ELETRIPTAN HYDROBROMIDE 20 MG PO TABS: 20.0000 mg | ORAL_TABLET | ORAL | 1 refills | Status: AC | PRN

## 2021-12-08 MED ORDER — AMOXICILLIN 875 MG PO TABS: 875.0000 mg | ORAL_TABLET | Freq: Two times a day (BID) | ORAL | 0 refills | Status: AC

## 2021-12-08 NOTE — Progress Notes (Signed)
Subjective:  Chase Rogers is a 51 y.o. male who presents for Chief Complaint  Patient presents with   Acute Visit    Sharp pains on the side of his head that's been going on for about 10 days. He had a nose bleed on Sunday that lasted about 30 mins.     Here for some new headaches and nosebleeds.  He has a history of migraine but has not had regular migraines in a while.  He did have what seemed like a migraine although yesterday.  Starting about 10 days ago he has been having some intermittent headaches.  They are isolated to the right side of the head lasting for 10 to 30 seconds at a time with sharp pains.  No diffuse pains in the head just isolated to this area.  They are intermittent but not numerous times a day.  No other steady headaches until yesterday.  He did awake yesterday with a mild headache that gradually turned into a migraine.  He notes having a nosebleed about 2 days ago that lasted about 30 minutes before it was stopped bleeding despite his efforts.  Then he had another slight bit of blood in the nose yesterday morning.  No other recent frequent nosebleeds.  He does have a history of getting some nosebleeds in the winter but this is out of the blue   He does report some right facial sinus pressure.  No sore throat no congestion otherwise.  In the past he has used Tylenol or Relpax for migraines.  Sometimes uses Excedrin.  He did take some Excedrin yesterday.  No other aggravating or relieving factors.    No other c/o.  Past Medical History:  Diagnosis Date   Allergy    Anxiety    Congenital single kidney    per prior ultrasound and other imaging   Depression    GERD (gastroesophageal reflux disease)    High triglycerides    History of EKG 2016   normal per report   Hyperlipidemia    Hypertension 2004   Migraine    Renal agenesis    Wart 2014   finger   Wears contact lenses    Current Outpatient Medications on File Prior to Visit  Medication Sig  Dispense Refill   albuterol (VENTOLIN HFA) 108 (90 Base) MCG/ACT inhaler TAKE 2 PUFFS BY MOUTH EVERY 6 HOURS AS NEEDED FOR WHEEZE OR SHORTNESS OF BREATH 8.5 each 0   buPROPion (WELLBUTRIN SR) 150 MG 12 hr tablet TAKE 1 TABLET BY MOUTH TWICE A DAY 180 tablet 1   esomeprazole (NEXIUM) 20 MG capsule Take 20 mg by mouth daily at 12 noon.     icosapent Ethyl (VASCEPA) 1 g capsule TAKE 2 CAPSULES TWICE A DAY 360 capsule 3   lisinopril (ZESTRIL) 5 MG tablet TAKE 1 TABLET BY MOUTH EVERY DAY 90 tablet 1   Multiple Vitamin (MULTIVITAMIN) tablet Take 1 tablet by mouth daily.     rosuvastatin (CRESTOR) 20 MG tablet TAKE 1 TABLET BY MOUTH EVERY DAY 90 tablet 1   cefdinir (OMNICEF) 300 MG capsule Take 1 capsule (300 mg total) by mouth 2 (two) times daily. (Patient not taking: Reported on 12/08/2021) 20 capsule 0   No current facility-administered medications on file prior to visit.    The following portions of the patient's history were reviewed and updated as appropriate: allergies, current medications, past family history, past medical history, past social history, past surgical history and problem list.  ROS Otherwise as in  subjective above    Objective: BP 120/80   Pulse 78   Wt 179 lb 9.6 oz (81.5 kg)   SpO2 97%   BMI 25.05 kg/m   Wt Readings from Last 3 Encounters:  12/08/21 179 lb 9.6 oz (81.5 kg)  11/23/21 177 lb 12.8 oz (80.6 kg)  08/21/21 170 lb 12.8 oz (77.5 kg)   BP Readings from Last 3 Encounters:  12/08/21 120/80  11/23/21 130/80  08/21/21 124/80    General appearance: alert, no distress, well developed, well nourished HEENT: normocephalic, no palpable engorged temporal artery, nontender of face and head, sclerae anicteric, conjunctiva pink and moist, TMs pearly, nares with erythema, just dried blood on right septum, and some thicker mucous in right nare, pharynx normal Oral cavity: MMM, no lesions Neck: supple, no lymphadenopathy, no thyromegaly, no masses Heart: RRR,  normal S1, S2, no murmurs Lungs: CTA bilaterally, no wheezes, rhonchi, or rales Pulses: 2+ radial pulses, 2+ pedal pulses, normal cap refill Ext: no edema Neuro: CN2-12 intact, nonfocal exam    Assessment: Encounter Diagnoses  Name Primary?   Nosebleed Yes   Nonintractable headache, unspecified chronicity pattern, unspecified headache type    History of migraine      Plan: We discussed her symptoms and concerns and exam findings.  We discussed possible differential and we discussed that the nosebleeds may not necessarily be related to the headaches.  I cannot completely rule out something more significant like tumor or other but currently no indication for head scan.  We discussed the differential which includes temporal arteritis, trigeminal neuralgia, migraine, or other headache syndrome.    We discussed using a round of amoxicillin for possible sinus infection.  Continue to rest and hydrate well.  Can use Excedrin or Tylenol or Relpax for migraine.  Use some ointment such as Neosporin or triple antibiotic ointment or Vaseline in the nostrils over the next few days to coat the irritated mucosa  If not much improved within a week let me know.  If worsening or new symptoms then we may need to get an MRI scan and try other treatment.    Hayk was seen today for acute visit.  Diagnoses and all orders for this visit:  Nosebleed  Nonintractable headache, unspecified chronicity pattern, unspecified headache type  History of migraine  Other orders -     amoxicillin (AMOXIL) 875 MG tablet; Take 1 tablet (875 mg total) by mouth 2 (two) times daily for 10 days. -     eletriptan (RELPAX) 20 MG tablet; Take 1 tablet (20 mg total) by mouth as needed for migraine or headache. May repeat in 2 hours if headache persists or recurs.    Follow up: call report within a week

## 2021-12-10 ENCOUNTER — Telehealth: Payer: Self-pay | Admitting: Medical

## 2021-12-14 NOTE — Telephone Encounter (Signed)
Call and check on his symptoms

## 2022-01-14 ENCOUNTER — Ambulatory Visit: Payer: BC Managed Care – PPO | Admitting: Medical

## 2022-01-14 VITALS — BP 120/80 | HR 74 | Wt 176.6 lb

## 2022-01-14 DIAGNOSIS — K648 Other hemorrhoids: Secondary | ICD-10-CM

## 2022-01-14 MED ORDER — HYDROCORTISONE 2.5 % EX CREA: TOPICAL_CREAM | Freq: Two times a day (BID) | CUTANEOUS | 0 refills | Status: AC

## 2022-01-14 MED ORDER — HYDROCORTISONE ACETATE 25 MG RE SUPP: 25.0000 mg | Freq: Two times a day (BID) | RECTAL | 1 refills | Status: DC

## 2022-01-14 NOTE — Patient Instructions (Signed)
Hemorrhoids: I prescribed Hydrocortisone 2.5% cream today.  You can apply this topically to the external hemorrhoid twice daily short term for 3-5 days at a time as needed for itching, irritations, or swelling I prescribed Hydrocortisone suppository (Anusol or Proctosol brand) today. You can use this twice daily per rectum short term for 3-5 days at a time for hemorrhoid flare up including itching, irritations, swelling or discomfort Please review the information below If not improving in the next few days or worsening, then call or recheck.  Medication costs:  If you get to the pharmacy and medication prescribed today was either too expensive, not covered by your insurance, or required prior authorization, then please call us back to let us know.  We often have no way to know if a medication is too expensive or not covered by your insurance.  Thanks for your cooperation.   Hemorrhoids Hemorrhoids are swollen veins in and around the rectum or anus. There are two types of hemorrhoids: Internal hemorrhoids. These occur in the veins that are just inside the rectum. They may poke through to the outside and become irritated and painful. External hemorrhoids. These occur in the veins that are outside of the anus and can be felt as a painful swelling or hard lump near the anus.  Most hemorrhoids do not cause serious problems, and they can be managed with home treatments such as diet and lifestyle changes. If home treatments do not help your symptoms, procedures can be done to shrink or remove the hemorrhoids. What are the causes? This condition is caused by increased pressure in the anal area. This pressure may result from various things, including: Constipation. Straining to have a bowel movement. Diarrhea. Pregnancy. Obesity. Sitting for long periods of time. Heavy lifting or other activity that causes you to strain. Anal sex.  What are the signs or symptoms? Symptoms of this condition  include: Pain. Anal itching or irritation. Rectal bleeding. Leakage of stool (feces). Anal swelling. One or more lumps around the anus.  How is this diagnosed? This condition can often be diagnosed through a visual exam. Other exams or tests may also be done, such as: Examination of the rectal area with a gloved hand (digital rectal exam). Examination of the anal canal using a small tube (anoscope). A blood test, if you have lost a significant amount of blood. A test to look inside the colon (sigmoidoscopy or colonoscopy).  How is this treated? This condition can usually be treated at home. However, various procedures may be done if dietary changes, lifestyle changes, and other home treatments do not help your symptoms. These procedures can help make the hemorrhoids smaller or remove them completely. Some of these procedures involve surgery, and others do not. Common procedures include: Rubber band ligation. Rubber bands are placed at the base of the hemorrhoids to cut off the blood supply to them. Sclerotherapy. Medicine is injected into the hemorrhoids to shrink them. Infrared coagulation. A type of light energy is used to get rid of the hemorrhoids. Hemorrhoidectomy surgery. The hemorrhoids are surgically removed, and the veins that supply them are tied off. Stapled hemorrhoidopexy surgery. A circular stapling device is used to remove the hemorrhoids and use staples to cut off the blood supply to them.  Follow these instructions at home: Eating and drinking Eat foods that have a lot of fiber in them, such as whole grains, beans, nuts, fruits, and vegetables. Ask your health care provider about taking products that have added fiber (fiber supplements).  Drink enough fluid to keep your urine clear or pale yellow. Managing pain and swelling Take warm sitz baths for 20 minutes, 3-4 times a day to ease pain and discomfort. If directed, apply ice to the affected area. Using ice packs between  sitz baths may be helpful. Put ice in a plastic bag. Place a towel between your skin and the bag. Leave the ice on for 20 minutes, 2-3 times a day. General instructions Take over-the-counter and prescription medicines only as told by your health care provider. Use medicated creams or suppositories as told. Exercise regularly. Go to the bathroom when you have the urge to have a bowel movement. Do not wait. Avoid straining to have bowel movements. Keep the anal area dry and clean. Use wet toilet paper or moist towelettes after a bowel movement. Do not sit on the toilet for long periods of time. This increases blood pooling and pain. Contact a health care provider if: You have increasing pain and swelling that are not controlled by treatment or medicine. You have uncontrolled bleeding. You have difficulty having a bowel movement, or you are unable to have a bowel movement. You have pain or inflammation outside the area of the hemorrhoids. This information is not intended to replace advice given to you by your health care provider. Make sure you discuss any questions you have with your health care provider. Document Released: 05/07/2000 Document Revised: 10/08/2015 Document Reviewed: 01/22/2015 Elsevier Interactive Patient Education  [employer].

## 2022-01-14 NOTE — Progress Notes (Signed)
Subjective:  Chase Rogers is a 51 y.o. male who presents for Chief Complaint  Patient presents with   ongoing hemorrhoid    Ongoing hemorrhoid- no bleeding. Very painful.      Here for hemorrhoid issues.  This has been on for a while maybe more than a month.  Gets painful hemorrhoid.  Sometimes he can feel it poking down.  Has not visible all the time.  No recent bleeding but has had a bleeding hemorrhoid in the remote past.  He does get pain at the rectum intermittently.  No pain with wiping necessarily.  He denies constipation but he is very careful to prevent constipation.  He uses fiber regularly, drinks plenty of water, denies any recent constipation or loose stools.  He has a regular daily bowel movement.  He tries to avoid foods that make him constipated.  He does exercise with cardio and weights.  No specific heavy lifting.  On the job he is sitting most of the time.   No other aggravating or relieving factors.    No other c/o.  The following portions of the patient's history were reviewed and updated as appropriate: allergies, current medications, past family history, past medical history, past social history, past surgical history and problem list.  ROS Otherwise as in subjective above    Objective: BP 120/80   Pulse 74   Wt 176 lb 9.6 oz (80.1 kg)   BMI 24.63 kg/m   General appearance: alert, no distress, well developed, well nourished Anus: external normal appearing, no obvious fissure, anoscope was performed.  On the left internal anal verge there is a approximately 4 mm diameter seemingly swollen inflamed hemorrhoid, otherwise no other abnormality Exam chaperoned by CMA   Assessment: Encounter Diagnosis  Name Primary?   Internal hemorrhoid Yes     Plan: We discussed symptoms and concerns.  We discussed exam findings demonstrating internal hemorrhoid.  We discussed conservative treatment as well.  He will use 3 to 5 days of suppository, hot soaks.  If not  improving in the next few days let me know.  Can use the same regimen periodically.  Can use external cream if he happens to have inflamed external hemorrhoid.  We also discussed that if he continues to have problems despite this treatment over the next month or 2 we may need to refer back to Dr. Tarri Rogers his gastroenterologist.  Hemorrhoids: I prescribed Hydrocortisone 2.5% cream today.  You can apply this topically to the external hemorrhoid twice daily short term for 3-5 days at a time as needed for itching, irritations, or swelling I prescribed Hydrocortisone suppository (Anusol or Proctosol brand) today. You can use this twice daily per rectum short term for 3-5 days at a time for hemorrhoid flare up including itching, irritations, swelling or discomfort Please review the information below If not improving in the next few days or worsening, then call or recheck.  Chase Rogers was seen today for ongoing hemorrhoid.  Diagnoses and all orders for this visit:  Internal hemorrhoid  Other orders -     hydrocortisone (ANUSOL-HC) 25 MG suppository; Place 1 suppository (25 mg total) rectally 2 (two) times daily. -     hydrocortisone 2.5 % cream; Apply topically 2 (two) times daily.    Follow up: prn

## 2022-01-27 ENCOUNTER — Encounter: Payer: Self-pay | Admitting: Internal Medicine

## 2022-03-18 ENCOUNTER — Ambulatory Visit: Payer: BC Managed Care – PPO | Admitting: Medical

## 2022-03-19 ENCOUNTER — Ambulatory Visit: Payer: BC Managed Care – PPO | Admitting: Medical

## 2022-03-19 VITALS — BP 122/80 | HR 79 | Temp 97.2°F | Wt 179.4 lb

## 2022-03-19 DIAGNOSIS — M25551 Pain in right hip: Secondary | ICD-10-CM | POA: Diagnosis not present

## 2022-03-19 DIAGNOSIS — M7071 Other bursitis of hip, right hip: Secondary | ICD-10-CM | POA: Diagnosis not present

## 2022-03-19 NOTE — Patient Instructions (Signed)
Your symptoms and exam today suggest hip bursitis and some hip tendinitis.  Recommendations. Use rest for the next 5 days After 5 days try doing cycling with stationary bike for the next 2 weeks instead of running Other exercises you can do in the meantime can include lap swimming at a pool, walking on softer texture, not asphalt You can use alternating ice and heat to the area since you have a hot tub You can use topical creams such as IcyHot or Aspercreme for example for comfort After 2 weeks of cycling you can start back at hiking or walking and gradually get back to running Short-term you can use over-the-counter Tylenol or Aleve but I would only use 1 Aleve a day for 5 to 7 days only, if you are going to use an anti-inflammatory.  Given the 1 kidney, the other option approach would be Tylenol for pain.  If you need some short-term stronger pain medication let me know  If not improving at all in the next 2 weeks we can consider a steroid injection  Hip Bursitis  Hip bursitis is swelling of one or more fluid-filled sacs (bursae) in your hip joint. If the bursa becomes irritated, it can fill with extra fluid and become swollen. This condition can cause pain, and your symptoms may come and go over time. What are the causes? Repeated use of your hip muscles. Injury to the hip. Weak butt muscles. Bone spurs. Infection. In some cases, the cause may not be known. What increases the risk? Having a past hip injury or hip surgery. Having a condition, such as arthritis, gout, diabetes, or thyroid disease. Having spine problems. Having one leg that is shorter than the other. Running a lot or doing long-distance running. Playing sports where there is a risk of injury or falling, such as football, martial arts, or skiing. What are the signs or symptoms? Symptoms may come and go, and they often include: Pain in the hip or groin area. Pain may get worse when you move your hip. Tenderness and  swelling of the hip. In rare cases, the bursa may become infected. If this happens, you may: Get a fever. Have warmth and redness in the hip area. How is this treated? This condition is treated by: Resting your hip. Icing your hip. Wrapping the hip area with an elastic bandage (compression wrap). Keeping the hip raised. Other treatments may include: Using crutches, a cane, or a walker. Medicines. Draining fluid out of the bursa. Surgery to take out a bursa. This is rare. Long-term treatment may include: Doing exercises to help your strength and flexibility. Lifestyle changes like losing weight to lessen the strain on your hip. Follow these instructions at home: Managing pain, stiffness, and swelling     If told, put ice on the painful area. To do this: Put ice in a plastic bag. Place a towel between your skin and the bag. Leave the ice on for 20 minutes, 2-3 times a day. Take off the ice if your skin turns bright red. This is very important. If you cannot feel pain, heat, or cold, you have a greater risk of damage to the area. Raise your hip by putting a pillow under your hips while you lie down. Stop if you feel pain. If told, put heat on the affected area. Do this as often as told by your doctor. Use the heat source that your doctor recommends, such as a moist heat pack or a heating pad. Place a towel  between your skin and the heat source. Leave the heat on for 20-30 minutes. Take off the heat if your skin turns bright red. This is very important. If you cannot feel pain, heat, or cold, you have a greater risk of getting burned. Activity Do not use your hip to support your body weight until your doctor says that you can. Use crutches, a cane, or a walker as told by your doctor. If the affected leg is one that you use to drive, ask your doctor if it is safe to drive. Rest and protect your hip as much as you can until you feel better. Return to your normal activities when your  doctor says that it is safe. Do exercises as told by your doctor. General instructions Take over-the-counter and prescription medicines only as told by your doctor. Gently rub and stretch your injured area as often as is comfortable. Wear elastic bandages only as told by your doctor. If one of your legs is shorter than the other, get fitted for a shoe insert or orthotic. Keep a healthy weight. Follow instructions from your doctor. Keep all follow-up visits. How is this prevented? Exercise regularly or as told by your doctor. Wear the right shoes for the sport you play and for daily activities. Warm up and stretch before being active. Cool down and stretch after being active. Take breaks often from repeated activity. Avoid activities that bother your hip or cause pain. Avoid sitting down for a long time. Where to find more information American Academy [employer]: orthoinfo.aaos.org Contact a doctor if: You have a fever. You have new symptoms. You have trouble walking or doing everyday activities. You have pain that gets worse or does not get better with medicine. The skin around your hip is red. You get a feeling of warmth in your hip area. Get help right away if: You cannot move your hip. You have very bad pain. You cannot control the muscles in your feet. Summary Hip bursitis is swelling of one or more fluid-filled sacs (bursae) in your hip joint. Symptoms often come and go over time. This condition is often treated by resting and icing the hip. It also may help to keep the area raised and wrapped in an elastic bandage. Other treatments may be needed. This information is not intended to replace advice given to you by your health care provider. Make sure you discuss any questions you have with your health care provider. Document Revised: 05/05/2021 Document Reviewed: 05/05/2021 Elsevier Patient Education  [address].

## 2022-03-19 NOTE — Progress Notes (Addendum)
Subjective:  Chase Rogers is a 51 y.o. male who presents for Chief Complaint  Patient presents with   pain in right hip    Pain in right hip x 1 month     Here for pain in right hip x 1 months.   No injury, no fall.  Runs 4-5 days per week for exercise.   Thinks he may have pushed too hard with running, and recently backed off running as much.   This past week at the beach, was having difficulty with right leg, and had new sharp pain in hip.  No abdominal or back pain.  No pain in knee or rest of leg.  Not using a lot of stairs.  No other new sports or activity.  No other aggravating or relieving factors.    No other c/o.  The following portions of the patient's history were reviewed and updated as appropriate: allergies, current medications, past family history, past medical history, past social history, past surgical history and problem list.  ROS Otherwise as in subjective above    Objective: BP 122/80   Pulse 79   Temp (!) 97.2 F (36.2 C)   Wt 179 lb 6.4 oz (81.4 kg)   BMI 25.02 kg/m   General appearance: alert, no distress, well developed, well nourished MSK: Tender over right trochanter bursa and a little superior to that of the lateral hip otherwise nontender with normal range of motion, no swelling or deformity of the legs otherwise No back tenderness, no sciatic tenderness Legs neurovascularly intact    Assessment: Encounter Diagnoses  Name Primary?   Right hip pain Yes   Bursitis of right hip, unspecified bursa      Plan: Your symptoms and exam today suggest hip bursitis and some hip tendinitis.  Recommendations. Use rest for the next 5 days After 5 days try doing cycling with stationary bike for the next 2 weeks instead of running Other exercises you can do in the meantime can include lap swimming at a pool, walking on softer texture, not asphalt You can use alternating ice and heat to the area since you have a hot tub You can use topical creams such  as IcyHot or Aspercreme for example for comfort After 2 weeks of cycling you can start back at hiking or walking and gradually get back to running Short-term you can use over-the-counter Tylenol or Aleve but I would only use 1 Aleve a day for 5 to 7 days only, if you are going to use an anti-inflammatory.  Given the 1 kidney, the other option approach would be Tylenol for pain.  If you need some short-term stronger pain medication let me know  If not improving at all in the next 2 weeks we can consider a steroid injection   Chase Rogers was seen today for pain in right hip.  Diagnoses and all orders for this visit:  Right hip pain  Bursitis of right hip, unspecified bursa    Follow up: prn

## 2022-04-08 ENCOUNTER — Other Ambulatory Visit: Payer: Self-pay | Admitting: Medical

## 2022-04-08 NOTE — Telephone Encounter (Signed)
Has upcoming appointment soon

## 2022-04-27 ENCOUNTER — Other Ambulatory Visit: Payer: Self-pay | Admitting: Medical

## 2022-05-11 ENCOUNTER — Telehealth: Payer: BC Managed Care – PPO | Admitting: Nurse Practitioner

## 2022-05-11 ENCOUNTER — Encounter: Payer: Self-pay | Admitting: Nurse Practitioner

## 2022-05-11 VITALS — Ht 71.0 in | Wt 176.0 lb

## 2022-05-11 DIAGNOSIS — J0140 Acute pansinusitis, unspecified: Secondary | ICD-10-CM | POA: Diagnosis not present

## 2022-05-11 MED ORDER — AMOXICILLIN-POT CLAVULANATE 875-125 MG PO TABS: 1.0000 | ORAL_TABLET | Freq: Two times a day (BID) | ORAL | 0 refills | Status: DC

## 2022-05-11 NOTE — Progress Notes (Signed)
Virtual Visit Encounter mychart visit.   I connected with  Chase Rogers on 05/11/22 at  4:00 PM EST by secure video and audio telemedicine application. I verified that I am speaking with the correct person using two identifiers.   I introduced myself as a Designer, jewellery with the practice. The limitations of evaluation and management by telemedicine discussed with the patient and the availability of in person appointments. The patient expressed verbal understanding and consent to proceed.  Participating parties in this visit include: Myself and patient  The patient is: Patient Location: Home I am: Provider Location: Office/Clinic Subjective:    CC and HPI: Chase Rogers is a 51 y.o. year old male presenting for new evaluation and treatment of sinusitis. Patient reports the following: Chase Rogers reports onset of congestion and sinus pressure a little over a week ago. He has been taking sudafed to help with his symptoms and felt like he was getting better until this morning when he woke up feeling worse. He endorses mucous production with dark green/brown coloration, congestion, rhinorrhea, and mild cough. He denies shortness of breath, wheezing, or fevers that are not controlled. He has tested negative for COVID.    Past medical history, Surgical history, Family history not pertinant except as noted below, Social history, Allergies, and medications have been entered into the medical record, reviewed, and corrections made.   Review of Systems:  All review of systems negative except what is listed in the HPI  Objective:    Alert and oriented x 4 Audible congestion.  Speaking in clear sentences with no shortness of breath. No distress.  Impression and Recommendations:    Problem List Items Addressed This Visit     Acute non-recurrent pansinusitis - Primary    Symptoms consistent with sinusitis, likely bacterial etiology given the course present and length of time he has been  experiencing symptoms.  Patient has tolerated Augmentin well in the past.  We will send this in for him today.  Recommend continuation of supportive care.  He has been using pseudoephedrine which has been helpful for some of his symptoms.  Discussed with patient that he may continue this.  May consider adding Mucinex to his regimen as well.  Encourage patient to drink plenty of fluids and rest.  If symptoms are not improved by Monday patient encouraged to reach back out for further guidance.      Relevant Medications   emtricitabine-tenofovir (TRUVADA) 200-300 MG tablet   pseudoephedrine (SUDAFED) 120 MG 12 hr tablet   amoxicillin-clavulanate (AUGMENTIN) 875-125 MG tablet    orders and follow up as documented in EMR I discussed the assessment and treatment plan with the patient. The patient was provided an opportunity to ask questions and all were answered. The patient agreed with the plan and demonstrated an understanding of the instructions.   The patient was advised to call back or seek an in-person evaluation if the symptoms worsen or if the condition fails to improve as anticipated.  Follow-Up: prn  I provided 16 minutes of non-face-to-face interaction with this non face-to-face encounter including intake, same-day documentation, and chart review.   Orma Render, NP , DNP, AGNP-c [place] at [facility] [phone] (fax)

## 2022-05-11 NOTE — Assessment & Plan Note (Signed)
Symptoms consistent with sinusitis, likely bacterial etiology given the course present and length of time he has been experiencing symptoms.  Patient has tolerated Augmentin well in the past.  We will send this in for him today.  Recommend continuation of supportive care.  He has been using pseudoephedrine which has been helpful for some of his symptoms.  Discussed with patient that he may continue this.  May consider adding Mucinex to his regimen as well.  Encourage patient to drink plenty of fluids and rest.  If symptoms are not improved by Monday patient encouraged to reach back out for further guidance.

## 2022-05-11 NOTE — Patient Instructions (Signed)
Your symptoms today are consistent with a sinus infection.   The following information is provided as a Human resources officer for ADULT patients only and does NOT take into account PREGNANCY, ALLERGIES, LIVER CONDITIONS, KIDNEY CONDITIONS, GASTROINTESTINAL CONDITIONS, OR PRESCRIPTION MEDICATION INTERACTIONS. Please be sure to ask your provider if the following are safe to take with your specific medical history, conditions, or current medication regimen if you are unsure.   Adult Basic Symptom Management for Sinusitis  Congestion: Guaifenesin (Mucinex)- follow directions on packaging with a maximum dose of '2400mg'$  in a 24 hour period.  Pain/Fever: Ibuprofen '200mg'$  - '400mg'$  every 4-6 hours as needed (MAX '1200mg'$  in a 24 hour period) Pain/Fever: Tylenol '500mg'$  -'1000mg'$  every 6-8 hours as needed (MAX '3000mg'$  in a 24 hour period)  Cough: Dextromethorphan (Delsym)- follow directions on packing with a maximum dose of '120mg'$  in a 24 hour period.  Nasal Stuffiness: Saline nasal spray and/or Nettie Pot with sterile saline solution  Runny Nose: Fluticasone nasal spray (Flonase) OR Mometasone nasal spray (Nasonex) OR Triamcinolone Acetonide nasal spray (Nasacort)- follow directions on the packaging  Pain/Pressure: Warm washcloth to the face  Sore Throat: Warm salt water gargles  If you have allergies, you may also consider taking an oral antihistamine (like Zyrtec or Claritin) as these may also help with your symptoms.  **Many medications will have more than one ingredient, be sure you are reading the packaging carefully and not taking more than one dose of the same kind of medication at the same time or too close together. It is OK to use formulas that have all of the ingredients you want, but do not take them in a combined medication and as separate dose too close together. If you have any questions, the pharmacist will be happy to help you decide what is safe.

## 2022-05-21 ENCOUNTER — Ambulatory Visit (INDEPENDENT_AMBULATORY_CARE_PROVIDER_SITE_OTHER): Payer: BC Managed Care – PPO | Admitting: Medical

## 2022-05-21 ENCOUNTER — Encounter: Payer: Self-pay | Admitting: Medical

## 2022-05-21 VITALS — BP 120/78 | HR 90 | Temp 98.3°F | Ht 72.0 in | Wt 178.8 lb

## 2022-05-21 DIAGNOSIS — Z905 Acquired absence of kidney: Secondary | ICD-10-CM | POA: Diagnosis not present

## 2022-05-21 DIAGNOSIS — Z299 Encounter for prophylactic measures, unspecified: Secondary | ICD-10-CM

## 2022-05-21 DIAGNOSIS — Z808 Family history of malignant neoplasm of other organs or systems: Secondary | ICD-10-CM

## 2022-05-21 DIAGNOSIS — I10 Essential (primary) hypertension: Secondary | ICD-10-CM

## 2022-05-21 DIAGNOSIS — Z7185 Encounter for immunization safety counseling: Secondary | ICD-10-CM

## 2022-05-21 DIAGNOSIS — R7301 Impaired fasting glucose: Secondary | ICD-10-CM

## 2022-05-21 DIAGNOSIS — E785 Hyperlipidemia, unspecified: Secondary | ICD-10-CM

## 2022-05-21 DIAGNOSIS — F324 Major depressive disorder, single episode, in partial remission: Secondary | ICD-10-CM

## 2022-05-21 DIAGNOSIS — Z0000 Encounter for general adult medical examination without abnormal findings: Secondary | ICD-10-CM

## 2022-05-21 DIAGNOSIS — Z125 Encounter for screening for malignant neoplasm of prostate: Secondary | ICD-10-CM

## 2022-05-21 LAB — POCT URINALYSIS DIP (PROADVANTAGE DEVICE)
Bilirubin, UA: NEGATIVE
Blood, UA: NEGATIVE
Glucose, UA: NEGATIVE mg/dL
Ketones, POC UA: NEGATIVE mg/dL
Leukocytes, UA: NEGATIVE
Nitrite, UA: NEGATIVE
Protein Ur, POC: NEGATIVE mg/dL
Specific Gravity, Urine: 1.01
Urobilinogen, Ur: 0.2
pH, UA: 6 (ref 5.0–8.0)

## 2022-05-21 NOTE — Progress Notes (Signed)
Subjective:   HPI  Chase Rogers is a 51 y.o. male who presents for Chief Complaint  Patient presents with   Annual Exam    CPE fasting labs, no other issues     Patient Care Team: Kenetha Cozza, Leward Quan as PCP - General (Family Medicine) Sees dentist Sees eye doctor  Concerns: Doing well  Exercising 5 days per week  Seeing health dept for truvada prep, started in October 2023.  Reviewed their medical, surgical, family, social, medication, and allergy history and updated chart as appropriate.  Past Medical History:  Diagnosis Date   Allergy    Anxiety    Congenital single kidney    per prior ultrasound and other imaging   Depression    Depression 05/03/2016   Depression, major, single episode, in partial remission ([place]) 05/14/2019   GERD (gastroesophageal reflux disease)    High triglycerides    History of EKG 2016   normal per report   Hyperlipidemia    Hypertension 2004   Migraine    Renal agenesis    Wart 2014   finger   Wears contact lenses     Past Surgical History:  Procedure Laterality Date   COLONOSCOPY  2012   abdominal pain, brother hx/o Crohns.  normal per patient   COLONOSCOPY W/ BIOPSIES  11/2020   KNEE SURGERY  2007   right meniscal tear   MINOR FULGERATION OF ANAL CONDYLOMA  2013   SHOULDER ARTHROSCOPY Left 11/2020   WISDOM TOOTH EXTRACTION      Family History  Problem Relation Age of Onset   Arthritis Mother    Hyperlipidemia Mother    Hypertension Mother    Melanoma Mother    Arthritis Father        TKR   Hypertension Father    Crohn's disease Brother    Heart attack Maternal Uncle    Heart disease Paternal Uncle    Heart attack Paternal Uncle    Stomach cancer Maternal Grandmother    Breast cancer Maternal Grandmother    Heart disease Maternal Grandfather    Heart attack Maternal Grandfather    Colon polyps Paternal Grandmother    Colon cancer Paternal Grandmother    Esophageal cancer Neg Hx    Pancreatic cancer Neg  Hx    Liver disease Neg Hx    Rectal cancer Neg Hx      Current Outpatient Medications:    albuterol (VENTOLIN HFA) 108 (90 Base) MCG/ACT inhaler, TAKE 2 PUFFS BY MOUTH EVERY 6 HOURS AS NEEDED FOR WHEEZE OR SHORTNESS OF BREATH, Disp: 8.5 each, Rfl: 0   buPROPion (WELLBUTRIN SR) 150 MG 12 hr tablet, TAKE 1 TABLET BY MOUTH TWICE A DAY, Disp: 180 tablet, Rfl: 1   eletriptan (RELPAX) 20 MG tablet, Take 1 tablet (20 mg total) by mouth as needed for migraine or headache. May repeat in 2 hours if headache persists or recurs., Disp: 10 tablet, Rfl: 1   emtricitabine-tenofovir (TRUVADA) 200-300 MG tablet, Take 1 tablet by mouth daily., Disp: , Rfl:    esomeprazole (NEXIUM) 20 MG capsule, Take 20 mg by mouth daily at 12 noon., Disp: , Rfl:    icosapent Ethyl (VASCEPA) 1 g capsule, TAKE 2 CAPSULES TWICE A DAY, Disp: 360 capsule, Rfl: 0   lisinopril (ZESTRIL) 5 MG tablet, TAKE 1 TABLET BY MOUTH EVERY DAY, Disp: 90 tablet, Rfl: 0   Multiple Vitamin (MULTIVITAMIN) tablet, Take 1 tablet by mouth daily., Disp: , Rfl:    rosuvastatin (CRESTOR) 20  MG tablet, TAKE 1 TABLET BY MOUTH EVERY DAY, Disp: 90 tablet, Rfl: 0   hydrocortisone (ANUSOL-HC) 25 MG suppository, Place 1 suppository (25 mg total) rectally 2 (two) times daily. (Patient not taking: Reported on 05/11/2022), Disp: 30 suppository, Rfl: 1   hydrocortisone 2.5 % cream, Apply topically 2 (two) times daily. (Patient not taking: Reported on 05/11/2022), Disp: 30 g, Rfl: 0  Allergies  Allergen Reactions   Erythromycin     Age 52, unsure of allergy     Review of Systems Constitutional: -fever, -chills, -sweats, -unexpected weight change, -decreased appetite, -fatigue Allergy: -sneezing, -itching, -congestion Dermatology: -changing moles, --rash, -lumps ENT: -runny nose, -ear pain, -sore throat, -hoarseness, -sinus pain, -teeth pain, - ringing in ears, -hearing loss, -nosebleeds Cardiology: -chest pain, -palpitations, -swelling, -difficulty breathing  when lying flat, -waking up short of breath Respiratory: -cough, -shortness of breath, -difficulty breathing with exercise or exertion, -wheezing, -coughing up blood Gastroenterology: -abdominal pain, -nausea, -vomiting, -diarrhea, -constipation, -blood in stool, -changes in bowel movement, -difficulty swallowing or eating Hematology: -bleeding, -bruising  Musculoskeletal: -joint aches, -muscle aches, -joint swelling, -back pain, -neck pain, -cramping, -changes in gait Ophthalmology: denies vision changes, eye redness, itching, discharge Urology: -burning with urination, -difficulty urinating, -blood in urine, -urinary frequency, -urgency, -incontinence Neurology: -headache, -weakness, -tingling, -numbness, -memory loss, -falls, -dizziness Psychology: -depressed mood, -agitation, -sleep problems Male GU: no testicular mass, pain, no lymph nodes swollen, no swelling, no rash.     05/21/2022    8:31 AM 01/14/2022    1:40 PM 07/30/2021   12:36 PM 05/19/2021    8:22 AM 01/22/2021   10:45 AM  Depression screen PHQ 2/9  Decreased Interest 0 0 0 0 0  Down, Depressed, Hopeless 0 0 0 0 0  PHQ - 2 Score 0 0 0 0 0  Altered sleeping    0 0  Tired, decreased energy    0 0  Change in appetite    0 0  Feeling bad or failure about yourself     0 0  Trouble concentrating    0 0  Moving slowly or fidgety/restless    0 0  Suicidal thoughts    0 0  PHQ-9 Score    0 0  Difficult doing work/chores    Not difficult at all Not difficult at all        Objective:  BP 120/78   Pulse 90   Temp 98.3 F (36.8 C)   Ht 6' (1.829 m)   Wt 178 lb 12.8 oz (81.1 kg)   BMI 24.25 kg/m   General appearance: alert, no distress, WD/WN, Caucasian male Skin: unremarkable, scorpion tattoo right inguinal region HEENT: normocephalic, conjunctiva/corneas normal, sclerae anicteric, PERRLA, EOMi, nares patent, no discharge or erythema, pharynx normal Neck: supple, no lymphadenopathy, no thyromegaly, no masses, normal ROM,  no bruits Chest: non tender, normal shape and expansion Heart: RRR, normal S1, S2, no murmurs Lungs: CTA bilaterally, no wheezes, rhonchi, or rales Abdomen: +bs, soft, non tender, non distended, no masses, no hepatomegaly, no splenomegaly, no bruits Back: non tender, normal ROM, no scoliosis Musculoskeletal: upper extremities non tender, no obvious deformity, normal ROM throughout, lower extremities non tender, no obvious deformity, normal ROM throughout Extremities: no edema, no cyanosis, no clubbing Pulses: 2+ symmetric, upper and lower extremities, normal cap refill Neurological: alert, oriented x 3, CN2-12 intact, strength normal upper extremities and lower extremities, sensation normal throughout, DTRs 2+ throughout, no cerebellar signs, gait normal Psychiatric: normal affect, behavior normal, pleasant  GU: normal male external genitalia,circumcised, nontender, no masses, no hernia, no lymphadenopathy Rectal: anus normal tone, prostate WNL   Assessment and Plan :   Encounter Diagnoses  Name Primary?   Encounter for health maintenance examination in adult Yes   Essential hypertension, benign    Vaccine counseling    Single kidney    Screening for prostate cancer    Hyperlipidemia, unspecified hyperlipidemia type    Family history of melanoma    Impaired fasting blood sugar    Prophylactic measure    Depression, major, single episode, in partial remission ([place])     This visit was a preventative care visit, also known as wellness visit or routine physical.   Topics typically include healthy lifestyle, diet, exercise, preventative care, vaccinations, sick and well care, proper use of emergency dept and after hours care, as well as other concerns.     Recommendations: Continue to return yearly for your annual wellness and preventative care visits.  This gives [place] a chance to discuss healthy lifestyle, exercise, vaccinations, review your chart record, and perform screenings where  appropriate.  I recommend you see your eye doctor yearly for routine vision care.  I recommend you see your dentist yearly for routine dental care including hygiene visits twice yearly.   Vaccination recommendations were reviewed Immunization History  Administered Date(s) Administered   Hepatitis A, Adult 05/25/2017, 01/05/2018   Hepatitis B, adult 05/25/2017, 06/27/2017, 01/05/2018   Influenza Inj Mdck Quad With Preservative 04/08/2018   Influenza Split 02/21/2021   Influenza,inj,Quad PF,6+ Mos 03/22/2019, 03/22/2019   Influenza-Unspecified 03/18/2016, 03/13/2017, 04/08/2018, 02/27/2020, 02/25/2022   Janssen (J&J) SARS-COV-2 Vaccination 08/03/2019   PFIZER SARS-COV-2 Pediatric Vaccination 5-47yr 03/17/2020   Pfizer Covid-19 Vaccine Bivalent Booster 183yr& up 05/19/2021   Tdap 05/12/2018   Unspecified SARS-COV-2 Vaccination 02/27/2022    Consider shingrix.   Screening for cancer: Colon cancer screening: I reviewed your colonoscopy on file that is up to date from 2022  We discussed PSA, prostate exam, and prostate cancer screening risks/benefits.     Skin cancer screening: Check your skin regularly for new changes, growing lesions, or other lesions of concern Come in for evaluation if you have skin lesions of concern.  Lung cancer screening: If you have a greater than 20 pack year history of tobacco use, then you may qualify for lung cancer screening with a chest CT scan.   Please call your insurance company to inquire about coverage for this test.  We currently don't have screenings for other cancers besides breast, cervical, colon, and lung cancers.  If you have a strong family history of cancer or have other cancer screening concerns, please let me know.    Bone health: Get at least 150 minutes of aerobic exercise weekly Get weight bearing exercise at least once weekly Bone density test:  A bone density test is an imaging test that uses a type of X-ray to measure the  amount of calcium and other minerals in your bones. The test may be used to diagnose or screen you for a condition that causes weak or thin bones (osteoporosis), predict your risk for a broken bone (fracture), or determine how well your osteoporosis treatment is working. The bone density test is recommended for females 6510nd older, or females or males <6<16f certain risk factors such as thyroid disease, long term use of steroids such as for asthma or rheumatological issues, vitamin D deficiency, estrogen deficiency, family history of osteoporosis, self or family history of fragility  fracture in first degree relative.    Heart health: Get at least 150 minutes of aerobic exercise weekly Limit alcohol It is important to maintain a healthy blood pressure and healthy cholesterol numbers  Heart disease screening: Screening for heart disease includes screening for blood pressure, fasting lipids, glucose/diabetes screening, BMI height to weight ratio, reviewed of smoking status, physical activity, and diet.    Goals include blood pressure 120/80 or less, maintaining a healthy lipid/cholesterol profile, preventing diabetes or keeping diabetes numbers under good control, not smoking or using tobacco products, exercising most days per week or at least 150 minutes per week of exercise, and eating healthy variety of fruits and vegetables, healthy oils, and avoiding unhealthy food choices like fried food, fast food, high sugar and high cholesterol foods.    Other tests may possibly include EKG test, CT coronary calcium score, echocardiogram, exercise treadmill stress test.     Medical care options: I recommend you continue to seek care here first for routine care.  We try really hard to have available appointments Monday through Friday daytime hours for sick visits, acute visits, and physicals.  Urgent care should be used for after hours and weekends for significant issues that cannot wait till the next day.   The emergency department should be used for significant potentially life-threatening emergencies.  The emergency department is expensive, can often have long wait times for less significant concerns, so try to utilize primary care, urgent care, or telemedicine when possible to avoid unnecessary trips to the emergency department.  Virtual visits and telemedicine have been introduced since the pandemic started in 2020, and can be convenient ways to receive medical care.  We offer virtual appointments as well to assist you in a variety of options to seek medical care.   Advanced Directives: I recommend you consider completing a [place] and Living Will.   These documents respect your wishes and help alleviate burdens on your loved ones if you were to become terminally ill or be in a position to need those documents enforced.    You can complete Advanced Directives yourself, have them notarized, then have copies made for our office, for you and for anybody you feel should have them in safe keeping.  Or, you can have an attorney prepare these documents.   If you haven't updated your Last Will and Testament in a while, it may be worthwhile having an attorney prepare these documents together and save on some costs.       Separate significant issues discussed: Hypertension-continue current medication  Impaired glucose-updated labs today  Single kidney-avoid NSAIDs, avoid dehydration and kidney toxic drugs  Prophylactic measure, on Prep, currently managed by health department  Depression in remission - doing fine on current therapy, wants to continue Wellbutrin  Hyperlipidemia - labs today, continue current medication   Raynell was seen today for annual exam.  Diagnoses and all orders for this visit:  Encounter for health maintenance examination in adult -     Comprehensive metabolic panel -     CBC with Differential/Platelet -     Lipid panel -     Hemoglobin A1c -      POCT Urinalysis DIP (Proadvantage Device) -     TSH -     PSA  Essential hypertension, benign  Vaccine counseling  Single kidney  Screening for prostate cancer -     PSA  Hyperlipidemia, unspecified hyperlipidemia type -     Lipid panel  Family  history of melanoma  Impaired fasting blood sugar -     Hemoglobin A1c  Prophylactic measure  Depression, major, single episode, in partial remission ([place])   Follow-up pending labs, yearly for physical

## 2022-05-21 NOTE — Patient Instructions (Signed)
This visit was a preventative care visit, also known as wellness visit or routine physical.   Topics typically include healthy lifestyle, diet, exercise, preventative care, vaccinations, sick and well care, proper use of emergency dept and after hours care, as well as other concerns.     Recommendations: Continue to return yearly for your annual wellness and preventative care visits.  This gives [place] a chance to discuss healthy lifestyle, exercise, vaccinations, review your chart record, and perform screenings where appropriate.  I recommend you see your eye doctor yearly for routine vision care.  I recommend you see your dentist yearly for routine dental care including hygiene visits twice yearly.   Vaccination recommendations were reviewed Immunization History  Administered Date(s) Administered   Hepatitis A, Adult 05/25/2017, 01/05/2018   Hepatitis B, adult 05/25/2017, 06/27/2017, 01/05/2018   Influenza Inj Mdck Quad With Preservative 04/08/2018   Influenza Split 02/21/2021   Influenza,inj,Quad PF,6+ Mos 03/22/2019, 03/22/2019   Influenza-Unspecified 03/18/2016, 03/13/2017, 04/08/2018, 02/27/2020, 02/25/2022   Janssen (J&J) SARS-COV-2 Vaccination 08/03/2019   PFIZER SARS-COV-2 Pediatric Vaccination 5-38yr 03/17/2020   Pfizer Covid-19 Vaccine Bivalent Booster 124yr& up 05/19/2021   Tdap 05/12/2018   Unspecified SARS-COV-2 Vaccination 02/27/2022    Consider shingrix.   Screening for cancer: Colon cancer screening: I reviewed your colonoscopy on file that is up to date from 2022  We discussed PSA, prostate exam, and prostate cancer screening risks/benefits.     Skin cancer screening: Check your skin regularly for new changes, growing lesions, or other lesions of concern Come in for evaluation if you have skin lesions of concern.  Lung cancer screening: If you have a greater than 20 pack year history of tobacco use, then you may qualify for lung cancer screening with a chest  CT scan.   Please call your insurance company to inquire about coverage for this test.  We currently don't have screenings for other cancers besides breast, cervical, colon, and lung cancers.  If you have a strong family history of cancer or have other cancer screening concerns, please let me know.    Bone health: Get at least 150 minutes of aerobic exercise weekly Get weight bearing exercise at least once weekly Bone density test:  A bone density test is an imaging test that uses a type of X-ray to measure the amount of calcium and other minerals in your bones. The test may be used to diagnose or screen you for a condition that causes weak or thin bones (osteoporosis), predict your risk for a broken bone (fracture), or determine how well your osteoporosis treatment is working. The bone density test is recommended for females 6536nd older, or females or males <6<16f certain risk factors such as thyroid disease, long term use of steroids such as for asthma or rheumatological issues, vitamin D deficiency, estrogen deficiency, family history of osteoporosis, self or family history of fragility fracture in first degree relative.    Heart health: Get at least 150 minutes of aerobic exercise weekly Limit alcohol It is important to maintain a healthy blood pressure and healthy cholesterol numbers  Heart disease screening: Screening for heart disease includes screening for blood pressure, fasting lipids, glucose/diabetes screening, BMI height to weight ratio, reviewed of smoking status, physical activity, and diet.    Goals include blood pressure 120/80 or less, maintaining a healthy lipid/cholesterol profile, preventing diabetes or keeping diabetes numbers under good control, not smoking or using tobacco products, exercising most days per week or at least 150 minutes per  week of exercise, and eating healthy variety of fruits and vegetables, healthy oils, and avoiding unhealthy food choices like fried  food, fast food, high sugar and high cholesterol foods.    Other tests may possibly include EKG test, CT coronary calcium score, echocardiogram, exercise treadmill stress test.     Medical care options: I recommend you continue to seek care here first for routine care.  We try really hard to have available appointments Monday through Friday daytime hours for sick visits, acute visits, and physicals.  Urgent care should be used for after hours and weekends for significant issues that cannot wait till the next day.  The emergency department should be used for significant potentially life-threatening emergencies.  The emergency department is expensive, can often have long wait times for less significant concerns, so try to utilize primary care, urgent care, or telemedicine when possible to avoid unnecessary trips to the emergency department.  Virtual visits and telemedicine have been introduced since the pandemic started in 2020, and can be convenient ways to receive medical care.  We offer virtual appointments as well to assist you in a variety of options to seek medical care.   Advanced Directives: I recommend you consider completing a [place] and Living Will.   These documents respect your wishes and help alleviate burdens on your loved ones if you were to become terminally ill or be in a position to need those documents enforced.    You can complete Advanced Directives yourself, have them notarized, then have copies made for our office, for you and for anybody you feel should have them in safe keeping.  Or, you can have an attorney prepare these documents.   If you haven't updated your Last Will and Testament in a while, it may be worthwhile having an attorney prepare these documents together and save on some costs.       Separate significant issues discussed: Hypertension-continue current medication  Impaired glucose-updated labs today  Single kidney-avoid NSAIDs, avoid  dehydration and kidney toxic drugs  Prophylactic measure, on Prep, currently managed by health department

## 2022-05-22 LAB — CBC WITH DIFFERENTIAL/PLATELET
Basophils Absolute: 0 10*3/uL (ref 0.0–0.2)
Basos: 1 %
EOS (ABSOLUTE): 0.1 10*3/uL (ref 0.0–0.4)
Eos: 2 %
Hematocrit: 46.6 % (ref 37.5–51.0)
Hemoglobin: 15.9 g/dL (ref 13.0–17.7)
Immature Grans (Abs): 0 10*3/uL (ref 0.0–0.1)
Immature Granulocytes: 0 %
Lymphocytes Absolute: 1.4 10*3/uL (ref 0.7–3.1)
Lymphs: 31 %
MCH: 30.7 pg (ref 26.6–33.0)
MCHC: 34.1 g/dL (ref 31.5–35.7)
MCV: 90 fL (ref 79–97)
Monocytes Absolute: 0.4 10*3/uL (ref 0.1–0.9)
Monocytes: 9 %
Neutrophils Absolute: 2.7 10*3/uL (ref 1.4–7.0)
Neutrophils: 57 %
Platelets: 356 10*3/uL (ref 150–450)
RBC: 5.18 x10E6/uL (ref 4.14–5.80)
RDW: 13.3 % (ref 11.6–15.4)
WBC: 4.6 10*3/uL (ref 3.4–10.8)

## 2022-05-22 LAB — COMPREHENSIVE METABOLIC PANEL
ALT: 40 IU/L (ref 0–44)
AST: 38 IU/L (ref 0–40)
Albumin/Globulin Ratio: 2.3 — ABNORMAL HIGH (ref 1.2–2.2)
Albumin: 4.8 g/dL (ref 3.8–4.9)
Alkaline Phosphatase: 89 IU/L (ref 44–121)
BUN/Creatinine Ratio: 15 (ref 9–20)
BUN: 18 mg/dL (ref 6–24)
Bilirubin Total: 0.5 mg/dL (ref 0.0–1.2)
CO2: 24 mmol/L (ref 20–29)
Calcium: 9.9 mg/dL (ref 8.7–10.2)
Chloride: 101 mmol/L (ref 96–106)
Creatinine, Ser: 1.18 mg/dL (ref 0.76–1.27)
Globulin, Total: 2.1 g/dL (ref 1.5–4.5)
Glucose: 98 mg/dL (ref 70–99)
Potassium: 4.9 mmol/L (ref 3.5–5.2)
Sodium: 138 mmol/L (ref 134–144)
Total Protein: 6.9 g/dL (ref 6.0–8.5)
eGFR: 75 mL/min/{1.73_m2} (ref >59)

## 2022-05-22 LAB — HEMOGLOBIN A1C
Est. average glucose Bld gHb Est-mCnc: 120 mg/dL
Hgb A1c MFr Bld: 5.8 % — ABNORMAL HIGH (ref 4.8–5.6)

## 2022-05-22 LAB — LIPID PANEL
Chol/HDL Ratio: 3.2 ratio (ref 0.0–5.0)
Cholesterol, Total: 158 mg/dL (ref 100–199)
HDL: 49 mg/dL (ref >39)
LDL Chol Calc (NIH): 89 mg/dL (ref 0–99)
Triglycerides: 109 mg/dL (ref 0–149)
VLDL Cholesterol Cal: 20 mg/dL (ref 5–40)

## 2022-05-22 LAB — TSH: TSH: 1.13 u[IU]/mL (ref 0.450–4.500)

## 2022-05-22 LAB — PSA: Prostate Specific Ag, Serum: 0.2 ng/mL (ref 0.0–4.0)

## 2022-05-25 ENCOUNTER — Other Ambulatory Visit: Payer: Self-pay | Admitting: Medical

## 2022-05-25 MED ORDER — BUPROPION HCL ER (SR) 150 MG PO TB12: 150.0000 mg | ORAL_TABLET | Freq: Two times a day (BID) | ORAL | 1 refills | Status: DC

## 2022-05-25 NOTE — Progress Notes (Signed)
Results sent through MyChart

## 2022-07-01 ENCOUNTER — Telehealth: Payer: Self-pay | Admitting: Medical

## 2022-07-01 ENCOUNTER — Other Ambulatory Visit: Payer: Self-pay | Admitting: Medical

## 2022-07-01 MED ORDER — BUPROPION HCL ER (SR) 150 MG PO TB12: 150.0000 mg | ORAL_TABLET | Freq: Two times a day (BID) | ORAL | 1 refills | Status: DC

## 2022-07-01 NOTE — Telephone Encounter (Signed)
New pharmacy sent refill request for wellbutrin [facility]   [facility]

## 2022-07-07 ENCOUNTER — Other Ambulatory Visit: Payer: Self-pay | Admitting: Medical

## 2022-07-26 ENCOUNTER — Telehealth: Payer: Self-pay | Admitting: Medical

## 2022-07-26 MED ORDER — ROSUVASTATIN CALCIUM 20 MG PO TABS: 20.0000 mg | ORAL_TABLET | Freq: Every day | ORAL | 1 refills | Status: DC

## 2022-07-26 NOTE — Telephone Encounter (Signed)
done 

## 2022-07-26 NOTE — Telephone Encounter (Signed)
Pharmacy sent refill request for crestor please send to the  [facility] [place], [place]

## 2022-08-17 ENCOUNTER — Telehealth: Payer: Self-pay | Admitting: Medical

## 2022-08-17 MED ORDER — LISINOPRIL 5 MG PO TABS: 5.0000 mg | ORAL_TABLET | Freq: Every day | ORAL | 0 refills | Status: DC

## 2022-08-17 NOTE — Telephone Encounter (Signed)
[facility] refill Lisinopril 5 mg #90 with 3 refills

## 2022-08-17 NOTE — Telephone Encounter (Signed)
done

## 2022-11-02 ENCOUNTER — Other Ambulatory Visit: Payer: Self-pay | Admitting: Medical

## 2022-11-30 ENCOUNTER — Encounter: Payer: Self-pay | Admitting: Medical

## 2022-11-30 ENCOUNTER — Ambulatory Visit: Payer: 59 | Admitting: Medical

## 2022-11-30 VITALS — BP 126/84 | HR 79 | Temp 98.8°F | Ht 72.0 in | Wt 181.4 lb

## 2022-11-30 DIAGNOSIS — M545 Low back pain, unspecified: Secondary | ICD-10-CM

## 2022-11-30 DIAGNOSIS — S39012A Strain of muscle, fascia and tendon of lower back, initial encounter: Secondary | ICD-10-CM

## 2022-11-30 DIAGNOSIS — J988 Other specified respiratory disorders: Secondary | ICD-10-CM

## 2022-11-30 DIAGNOSIS — J3489 Other specified disorders of nose and nasal sinuses: Secondary | ICD-10-CM

## 2022-11-30 MED ORDER — AMOXICILLIN 875 MG PO TABS: 875.0000 mg | ORAL_TABLET | Freq: Two times a day (BID) | ORAL | 0 refills | Status: AC

## 2022-11-30 MED ORDER — TIZANIDINE HCL 4 MG PO TABS: 4.0000 mg | ORAL_TABLET | Freq: Two times a day (BID) | ORAL | 0 refills | Status: DC | PRN

## 2022-11-30 NOTE — Progress Notes (Signed)
Subjective:  Chase Rogers is a 52 y.o. male who presents for Chief Complaint  Patient presents with   Back Pain    Low back pain right side, does not a kidney in that area. Did a tough workout so he cough have pulled a muscle. Also started having discolored mucus Sunday. Two neg home covid tests.      Here for concerns.  Here for back pains, but has had some improvement since he made the appt.  3 weeks ago did back work out at [employer] Few days later had pain in lower back on right.  Pain was sharp, particularly with bending over.  In bed if lying on left side, right low back would hurt.  Exercised this past Sunday 2 days ago and aggravated the back.  Sometimes has used some tylenol.   No pain or paresthesias in legs.  Has new sinus issues.  He notes 3 day hx/o nasal drainage, productive cough, phlegm, feels likes its in throat, not sleeping from all the cough.   Has had some sore throat.  No ear pain.  Some dyspnea, but no wheezing.  No body aches chills or fever.  Had 2 home covid tests that were negative. Used some dayquil today, but no other medications.  No hx/o asthma but in recent years has had to use inhalers in spring allergy symptoms.   Is currently using albuterol with current symptoms, but was using maybe once monthly or less.    No other aggravating or relieving factors.    No other c/o.  Past Medical History:  Diagnosis Date   Allergy    Anxiety    Congenital single kidney    per prior ultrasound and other imaging   Depression    Depression 05/03/2016   Depression, major, single episode, in partial remission (HCC) 05/14/2019   GERD (gastroesophageal reflux disease)    High triglycerides    History of EKG 2016   normal per report   Hyperlipidemia    Hypertension 2004   Migraine    Renal agenesis    Wart 2014   finger   Wears contact lenses    Current Outpatient Medications on File Prior to Visit  Medication Sig Dispense Refill   albuterol (VENTOLIN HFA) 108  (90 Base) MCG/ACT inhaler TAKE 2 PUFFS BY MOUTH EVERY 6 HOURS AS NEEDED FOR WHEEZE OR SHORTNESS OF BREATH 8.5 each 0   buPROPion (WELLBUTRIN SR) 150 MG 12 hr tablet Take 1 tablet (150 mg total) by mouth 2 (two) times daily. 180 tablet 1   DESCOVY 200-25 MG tablet Take 1 tablet by mouth daily.     icosapent Ethyl (VASCEPA) 1 g capsule TAKE 2 CAPSULES TWICE A DAY 360 capsule 1   lisinopril (ZESTRIL) 5 MG tablet TAKE 1 TABLET DAILY 90 tablet 1   Multiple Vitamin (MULTIVITAMIN) tablet Take 1 tablet by mouth daily.     Pseudoephedrine-APAP-DM (DAYQUIL PO) Take 2 capsules by mouth as needed.     rosuvastatin (CRESTOR) 20 MG tablet Take 1 tablet (20 mg total) by mouth daily. 90 tablet 1   eletriptan (RELPAX) 20 MG tablet Take 1 tablet (20 mg total) by mouth as needed for migraine or headache. May repeat in 2 hours if headache persists or recurs. (Patient not taking: Reported on 11/30/2022) 10 tablet 1   esomeprazole (NEXIUM) 20 MG capsule Take 20 mg by mouth daily at 12 noon. (Patient not taking: Reported on 11/30/2022)     hydrocortisone (ANUSOL-HC) 25 MG  suppository Place 1 suppository (25 mg total) rectally 2 (two) times daily. (Patient not taking: Reported on 05/11/2022) 30 suppository 1   hydrocortisone 2.5 % cream Apply topically 2 (two) times daily. (Patient not taking: Reported on 05/11/2022) 30 g 0   No current facility-administered medications on file prior to visit.    The following portions of the patient's history were reviewed and updated as appropriate: allergies, current medications, past family history, past medical history, past social history, past surgical history and problem list.  ROS Otherwise as in subjective above    Objective: BP 126/84   Pulse 79   Temp 98.8 F (37.1 C) (Tympanic)   Ht 6' (1.829 m)   Wt 181 lb 6.4 oz (82.3 kg)   SpO2 97%   BMI 24.60 kg/m   General appearance: alert, no distress, well developed, well nourished Tender in the right lumbar paraspinal  region, otherwise nontender, normal back range of motion, no deformity or swelling. Legs nontender with normal range of motion  Arms & legs neurovascularly intact HEENT: normocephalic, sclerae anicteric, conjunctiva pink and moist, TMs flat, nares with mucoid discharge & erythema, pharynx with some post nasal drainage Oral cavity: MMM, no lesions Neck: supple, no lymphadenopathy, no thyromegaly, no masses Heart: RRR, normal S1, S2, no murmurs Lungs: CTA bilaterally, no wheezes, rhonchi, or rales Pulses: 2+ radial pulses, 2+ pedal pulses, normal cap refill Ext: no edema   Assessment: Encounter Diagnoses  Name Primary?   Respiratory tract infection Yes   Purulent nasal discharge    Back strain, initial encounter    Acute right-sided low back pain without sciatica      Plan: Respiratory tract infection, purulent nasal discharge-advised a few more days of decongestant and/or allergy medication to help with congestion and drainage.  If persistent colored mucus and not improving at all within the next 3 days can begin the amoxicillin antibiotic.  Continue to hydrate well and use nasal saline flush.  Back strain -avoid reinjury.  Advised relative rest, stretching, topical muscle rubs such as Biofreeze, begin tizanidine once or twice a day for the next few days.  Caution with sedation with the medication.  Can continue Tylenol by mouth.  Avoid NSAIDs given unilateral kidney.  If not seeing improvement over the next 10 days, consider massage therapy or physical therapy referral.  No findings suggestive of radicular issues today.   Chase Rogers was seen today for back pain.  Diagnoses and all orders for this visit:  Respiratory tract infection  Purulent nasal discharge  Back strain, initial encounter  Acute right-sided low back pain without sciatica  Other orders -     tiZANidine (ZANAFLEX) 4 MG tablet; Take 1 tablet (4 mg total) by mouth 2 (two) times daily as needed for muscle spasms. -      amoxicillin (AMOXIL) 875 MG tablet; Take 1 tablet (875 mg total) by mouth 2 (two) times daily for 10 days.    Follow up: prn

## 2022-12-06 ENCOUNTER — Other Ambulatory Visit: Payer: Self-pay | Admitting: Medical

## 2022-12-30 ENCOUNTER — Other Ambulatory Visit: Payer: Self-pay | Admitting: Medical

## 2023-01-03 ENCOUNTER — Other Ambulatory Visit: Payer: Self-pay | Admitting: Medical

## 2023-01-21 ENCOUNTER — Telehealth: Payer: Self-pay | Admitting: Urgent Care

## 2023-01-21 DIAGNOSIS — U071 COVID-19: Secondary | ICD-10-CM

## 2023-01-21 MED ORDER — MOLNUPIRAVIR EUA 200MG CAPSULE: 4.0000 | ORAL_CAPSULE | Freq: Two times a day (BID) | ORAL | 0 refills | Status: AC

## 2023-01-21 NOTE — Patient Instructions (Signed)
Delice Bison, thank you for joining [name], PA for today's virtual visit.  While this provider is not your primary care provider (PCP), if your PCP is located in our provider database this encounter information will be shared with them immediately following your visit.   A [facility] MyChart account gives you access to today's visit and all your visits, tests, and labs performed at [facility] " click here if you don't have a [facility] MyChart account or go to [email or web address]  Consent: (Patient) Delice Bison provided verbal consent for this virtual visit at the beginning of the encounter.  Current Medications:  Current Outpatient Medications:    molnupiravir EUA (LAGEVRIO) 200 mg CAPS capsule, Take 4 capsules (800 mg total) by mouth 2 (two) times daily for 5 days., Disp: 40 capsule, Rfl: 0   albuterol (VENTOLIN HFA) 108 (90 Base) MCG/ACT inhaler, TAKE 2 PUFFS BY MOUTH EVERY 6 HOURS AS NEEDED FOR WHEEZE OR SHORTNESS OF BREATH, Disp: 8.5 each, Rfl: 0   buPROPion (WELLBUTRIN SR) 150 MG 12 hr tablet, TAKE 1 TABLET TWICE A DAY, Disp: 180 tablet, Rfl: 0   DESCOVY 200-25 MG tablet, Take 1 tablet by mouth daily., Disp: , Rfl:    eletriptan (RELPAX) 20 MG tablet, Take 1 tablet (20 mg total) by mouth as needed for migraine or headache. May repeat in 2 hours if headache persists or recurs. (Patient not taking: Reported on 11/30/2022), Disp: 10 tablet, Rfl: 1   esomeprazole (NEXIUM) 20 MG capsule, Take 20 mg by mouth daily at 12 noon. (Patient not taking: Reported on 11/30/2022), Disp: , Rfl:    hydrocortisone (ANUSOL-HC) 25 MG suppository, Place 1 suppository (25 mg total) rectally 2 (two) times daily. (Patient not taking: Reported on 05/11/2022), Disp: 30 suppository, Rfl: 1   hydrocortisone 2.5 % cream, Apply topically 2 (two) times daily. (Patient not taking: Reported on 05/11/2022), Disp: 30 g, Rfl: 0   icosapent Ethyl (VASCEPA) 1 g capsule, TAKE 2 CAPSULES TWICE  A DAY, Disp: 360 capsule, Rfl: 1   lisinopril (ZESTRIL) 5 MG tablet, TAKE 1 TABLET DAILY, Disp: 90 tablet, Rfl: 1   Multiple Vitamin (MULTIVITAMIN) tablet, Take 1 tablet by mouth daily., Disp: , Rfl:    Pseudoephedrine-APAP-DM (DAYQUIL PO), Take 2 capsules by mouth as needed., Disp: , Rfl:    rosuvastatin (CRESTOR) 20 MG tablet, TAKE 1 TABLET DAILY, Disp: 90 tablet, Rfl: 0   tiZANidine (ZANAFLEX) 4 MG tablet, Take 1 tablet (4 mg total) by mouth 2 (two) times daily as needed for muscle spasms., Disp: 20 tablet, Rfl: 0   Medications ordered in this encounter:  Meds ordered this encounter  Medications   molnupiravir EUA (LAGEVRIO) 200 mg CAPS capsule    Sig: Take 4 capsules (800 mg total) by mouth 2 (two) times daily for 5 days.    Dispense:  40 capsule    Refill:  0     *If you need refills on other medications prior to your next appointment, please contact your pharmacy*  Follow-Up: Call back or seek an in-person evaluation if the symptoms worsen or if the condition fails to improve as anticipated.  [facility] [phone]  Other Instructions Start the molnupiravir today. Take 4 capsules twice daily x 5 days. Drink plenty of water, stay hydrated and rest. Quarantine until symptoms improve/ resolve, or until >24 hours after fever resolves (if you have one). Consider using OTC Quercetin which can help with covid-like symptoms. Oscillococcinum, an OTC natural medication,  can help if you develop body aches.  If you develop uncontrolled fever, shortness of breath, chest pain, or pain and swelling in your calf, please head to the Emergency Room.   If you have been instructed to have an in-person evaluation today at a local Urgent Care facility, please use the link below. It will take you to a list of all of our available [facility], including address, phone number and hours of operation. Please do not delay care.  [facility]  If you or a family  member do not have a primary care provider, use the link below to schedule a visit and establish care. When you choose a [facility] primary care physician or advanced practice provider, you gain a long-term partner in health. Find a Primary Care Provider  Learn more about [facility]'s in-office and virtual care options:  - Get Care Now

## 2023-01-21 NOTE — Progress Notes (Signed)
Virtual Visit Consent   Chase Rogers, you are scheduled for a virtual visit with a [facility] provider today. Just as with appointments in the office, your consent must be obtained to participate. Your consent will be active for this visit and any virtual visit you may have with one of our providers in the next 365 days. If you have a MyChart account, a copy of this consent can be sent to you electronically.  As this is a virtual visit, video technology does not allow for your provider to perform a traditional examination. This may limit your provider's ability to fully assess your condition. If your provider identifies any concerns that need to be evaluated in person or the need to arrange testing (such as labs, EKG, etc.), we will make arrangements to do so. Although advances in technology are sophisticated, we cannot ensure that it will always work on either your end or our end. If the connection with a video visit is poor, the visit may have to be switched to a telephone visit. With either a video or telephone visit, we are not always able to ensure that we have a secure connection.  By engaging in this virtual visit, you consent to the provision of healthcare and authorize for your insurance to be billed (if applicable) for the services provided during this visit. Depending on your insurance coverage, you may receive a charge related to this service.  I need to obtain your verbal consent now. Are you willing to proceed with your visit today? Chase Rogers has provided verbal consent on 01/21/2023 for a virtual visit (video or telephone). Chase Bees, PA  Date: 01/21/2023 9:41 AM  Virtual Visit via Video Note   I, Chase Rogers, connected with  Chase Rogers  ([id number], [birth year]) on 01/21/23 at  9:15 AM EDT by a video-enabled telemedicine application and verified that I am speaking with the correct person using two identifiers.  Location: Patient: Virtual Visit Location  Patient: Home Provider: Virtual Visit Location Provider: Home Office   I discussed the limitations of evaluation and management by telemedicine and the availability of in person appointments. The patient expressed understanding and agreed to proceed.    History of Present Illness: Chase Rogers is a 52 y.o. male who is being seen today for positive covid test.  HPI: 52yo male presents today with positive covid test at home this morning. States his husband tested positive on Tuesday. Pt has been testing every day since, first positive this morning. Pt reports sx of feeling hot last night, difficulty sleeping, mild nasal congestion and feeling run down. Pt has had covid in the past, believes in 2022. No long term complications or serious issues previously. Pt requesting antiviral therapy given risk factors and chronic medical conditions. Pt denies fever, sob, CP, DOE. Pt was born with only one kidney, last GFR 75 in Dec 2023.    Problems:  Patient Active Problem List   Diagnosis Date Noted   Impaired fasting blood sugar 05/21/2022   Prophylactic measure 05/21/2022   Acute non-recurrent pansinusitis 05/11/2022   Hepatic cyst 05/14/2020   Chronic right hip pain 05/14/2020   Chronic left shoulder pain 05/14/2020   Depression, major, in remission (HCC) 05/14/2020   Trochanteric bursitis of right hip 08/02/2019   Screening for prostate cancer 05/14/2019   Screen for STD (sexually transmitted disease) 05/14/2019   Elevated pulse rate 05/14/2019   Onychomycosis 05/14/2019   Rash 05/14/2019   Depression, major, single episode, in partial remission (  HCC) 05/14/2019   Sleep disturbance 11/15/2018   Work stress 11/15/2018   History of syphilis 05/12/2018   Family history of melanoma 05/12/2018   Single kidney 05/12/2018   Need for Tdap vaccination 05/12/2018   Exposure to communicable disease 05/11/2017   Encounter for health maintenance examination in adult 05/03/2016   Essential  hypertension, benign 05/03/2016   Hyperlipidemia 05/03/2016   Vaccine counseling 05/03/2016   Gastroesophageal reflux disease without esophagitis 05/03/2016   Routine general medical examination at a health care facility 05/03/2016    Allergies:  Allergies  Allergen Reactions   Erythromycin     Age 64, unsure of allergy   Medications:  Current Outpatient Medications:    molnupiravir EUA (LAGEVRIO) 200 mg CAPS capsule, Take 4 capsules (800 mg total) by mouth 2 (two) times daily for 5 days., Disp: 40 capsule, Rfl: 0   albuterol (VENTOLIN HFA) 108 (90 Base) MCG/ACT inhaler, TAKE 2 PUFFS BY MOUTH EVERY 6 HOURS AS NEEDED FOR WHEEZE OR SHORTNESS OF BREATH, Disp: 8.5 each, Rfl: 0   buPROPion (WELLBUTRIN SR) 150 MG 12 hr tablet, TAKE 1 TABLET TWICE A DAY, Disp: 180 tablet, Rfl: 0   DESCOVY 200-25 MG tablet, Take 1 tablet by mouth daily., Disp: , Rfl:    eletriptan (RELPAX) 20 MG tablet, Take 1 tablet (20 mg total) by mouth as needed for migraine or headache. May repeat in 2 hours if headache persists or recurs. (Patient not taking: Reported on 11/30/2022), Disp: 10 tablet, Rfl: 1   esomeprazole (NEXIUM) 20 MG capsule, Take 20 mg by mouth daily at 12 noon. (Patient not taking: Reported on 11/30/2022), Disp: , Rfl:    hydrocortisone (ANUSOL-HC) 25 MG suppository, Place 1 suppository (25 mg total) rectally 2 (two) times daily. (Patient not taking: Reported on 05/11/2022), Disp: 30 suppository, Rfl: 1   hydrocortisone 2.5 % cream, Apply topically 2 (two) times daily. (Patient not taking: Reported on 05/11/2022), Disp: 30 g, Rfl: 0   icosapent Ethyl (VASCEPA) 1 g capsule, TAKE 2 CAPSULES TWICE A DAY, Disp: 360 capsule, Rfl: 1   lisinopril (ZESTRIL) 5 MG tablet, TAKE 1 TABLET DAILY, Disp: 90 tablet, Rfl: 1   Multiple Vitamin (MULTIVITAMIN) tablet, Take 1 tablet by mouth daily., Disp: , Rfl:    Pseudoephedrine-APAP-DM (DAYQUIL PO), Take 2 capsules by mouth as needed., Disp: , Rfl:    rosuvastatin (CRESTOR) 20  MG tablet, TAKE 1 TABLET DAILY, Disp: 90 tablet, Rfl: 0   tiZANidine (ZANAFLEX) 4 MG tablet, Take 1 tablet (4 mg total) by mouth 2 (two) times daily as needed for muscle spasms., Disp: 20 tablet, Rfl: 0  Observations/Objective: Patient is well-developed, well-nourished in no acute distress.  Resting comfortably sitting upright in chair at home.  Non-toxic appearing. Smiling. Head is normocephalic, atraumatic.  No labored breathing. No coughing Speech is clear and coherent with logical content.  Patient is alert and oriented at baseline.  No visible rhinorrhea or scleral injection.  Assessment and Plan: 1. COVID-19  Pt with positive home covid test, mild sx since last evening. Pt does have risk factors to warrant antiviral therapy. Pt with solitary kidney, no recent updated renal function labs. Additionally, pt on statin therapy therefore discussed molnupiravir in place of paxlovid. Discussed expectations with medication; additional supportive therapy with quercetin and oscillococcinum reviewed. All questions addressed.   Follow Up Instructions: I discussed the assessment and treatment plan with the patient. The patient was provided an opportunity to ask questions and all were answered. The patient agreed  with the plan and demonstrated an understanding of the instructions.  A copy of instructions were sent to the patient via MyChart unless otherwise noted below.    The patient was advised to call back or seek an in-person evaluation if the symptoms worsen or if the condition fails to improve as anticipated.  Time:  I spent 8 minutes with the patient via telehealth technology discussing the above problems/concerns.    Kaliann Coryell L Tameka Hoiland, PA

## 2023-02-03 ENCOUNTER — Telehealth: Payer: 59 | Admitting: Medical

## 2023-03-07 ENCOUNTER — Other Ambulatory Visit: Payer: Self-pay | Admitting: Medical

## 2023-03-30 ENCOUNTER — Other Ambulatory Visit: Payer: Self-pay | Admitting: Medical

## 2023-03-30 NOTE — Telephone Encounter (Signed)
Has an appt end of December

## 2023-04-06 ENCOUNTER — Other Ambulatory Visit: Payer: Self-pay | Admitting: Medical

## 2023-04-21 ENCOUNTER — Other Ambulatory Visit: Payer: Self-pay | Admitting: Medical

## 2023-04-25 NOTE — Telephone Encounter (Signed)
Has upcoming appt in December  

## 2023-05-24 ENCOUNTER — Ambulatory Visit: Payer: 59 | Admitting: Medical

## 2023-05-24 VITALS — BP 120/84 | HR 91 | Ht 72.0 in | Wt 187.2 lb

## 2023-05-24 DIAGNOSIS — Z0000 Encounter for general adult medical examination without abnormal findings: Secondary | ICD-10-CM

## 2023-05-24 DIAGNOSIS — R7301 Impaired fasting glucose: Secondary | ICD-10-CM

## 2023-05-24 DIAGNOSIS — Z7185 Encounter for immunization safety counseling: Secondary | ICD-10-CM

## 2023-05-24 DIAGNOSIS — Z905 Acquired absence of kidney: Secondary | ICD-10-CM | POA: Diagnosis not present

## 2023-05-24 DIAGNOSIS — K219 Gastro-esophageal reflux disease without esophagitis: Secondary | ICD-10-CM | POA: Diagnosis not present

## 2023-05-24 DIAGNOSIS — Z808 Family history of malignant neoplasm of other organs or systems: Secondary | ICD-10-CM

## 2023-05-24 DIAGNOSIS — Z299 Encounter for prophylactic measures, unspecified: Secondary | ICD-10-CM

## 2023-05-24 DIAGNOSIS — Z125 Encounter for screening for malignant neoplasm of prostate: Secondary | ICD-10-CM

## 2023-05-24 DIAGNOSIS — E785 Hyperlipidemia, unspecified: Secondary | ICD-10-CM | POA: Diagnosis not present

## 2023-05-24 LAB — POCT URINALYSIS DIP (PROADVANTAGE DEVICE)
Bilirubin, UA: NEGATIVE
Blood, UA: NEGATIVE
Glucose, UA: NEGATIVE mg/dL
Ketones, POC UA: NEGATIVE mg/dL
Leukocytes, UA: NEGATIVE
Nitrite, UA: NEGATIVE
Protein Ur, POC: NEGATIVE mg/dL
Specific Gravity, Urine: 1.01
Urobilinogen, Ur: NEGATIVE
pH, UA: 7.5 (ref 5.0–8.0)

## 2023-05-24 LAB — LIPID PANEL

## 2023-05-24 NOTE — Progress Notes (Signed)
 Subjective:   HPI  Chase Rogers is a 52 y.o. male who presents for Chief Complaint  Patient presents with   Annual Exam    Fasting cpe, no concerns, declines vaccines today    Patient Care Team: Cielo Arias, Alm GORMAN RIGGERS as PCP - General (Family Medicine) Sees dentist Sees eye doctor Dr. Suzen Brass, GI Dr. Ryan Gammon, [facility]   Concerns: Doing well  Exercising 5 days per week   Reviewed their medical, surgical, family, social, medication, and allergy history and updated chart as appropriate.  Past Medical History:  Diagnosis Date   Allergy    Anxiety    Congenital single kidney    per prior ultrasound and other imaging   Depression    Depression 05/03/2016   Depression, major, single episode, in partial remission (HCC) 05/14/2019   GERD (gastroesophageal reflux disease)    High triglycerides    History of EKG 2016   normal per report   Hyperlipidemia    Hypertension 2004   Migraine    Renal agenesis    Wart 2014   finger   Wears contact lenses     Past Surgical History:  Procedure Laterality Date   COLONOSCOPY  2012   abdominal pain, brother hx/o Crohns.  normal per patient   COLONOSCOPY W/ BIOPSIES  11/2020   KNEE SURGERY  2007   right meniscal tear   MINOR FULGERATION OF ANAL CONDYLOMA  2013   SHOULDER ARTHROSCOPY Left 11/2020   WISDOM TOOTH EXTRACTION      Family History  Problem Relation Age of Onset   Arthritis Mother    Hyperlipidemia Mother    Hypertension Mother    Melanoma Mother    Arthritis Father        TKR   Hypertension Father    Crohn's disease Brother    Heart attack Maternal Uncle    Heart disease Paternal Uncle    Heart attack Paternal Uncle    Stomach cancer Maternal Grandmother    Breast cancer Maternal Grandmother    Heart disease Maternal Grandfather    Heart attack Maternal Grandfather    Colon polyps Paternal Grandmother    Colon cancer Paternal Grandmother    Esophageal cancer Neg Hx     Pancreatic cancer Neg Hx    Liver disease Neg Hx    Rectal cancer Neg Hx      Current Outpatient Medications:    albuterol  (VENTOLIN  HFA) 108 (90 Base) MCG/ACT inhaler, TAKE 2 PUFFS BY MOUTH EVERY 6 HOURS AS NEEDED FOR WHEEZE OR SHORTNESS OF BREATH, Disp: 8.5 each, Rfl: 0   buPROPion  (WELLBUTRIN  SR) 150 MG 12 hr tablet, TAKE 1 TABLET TWICE A DAY, Disp: 180 tablet, Rfl: 0   DESCOVY 200-25 MG tablet, Take 1 tablet by mouth daily., Disp: , Rfl:    eletriptan  (RELPAX ) 20 MG tablet, Take 1 tablet (20 mg total) by mouth as needed for migraine or headache. May repeat in 2 hours if headache persists or recurs., Disp: 10 tablet, Rfl: 1   esomeprazole (NEXIUM) 20 MG capsule, Take 20 mg by mouth daily at 12 noon., Disp: , Rfl:    hydrocortisone  (ANUSOL -HC) 25 MG suppository, PLACE 1 SUPPOSITORY RECTALLY 2 TIMES DAILY., Disp: 30 suppository, Rfl: 1   hydrocortisone  2.5 % cream, Apply topically 2 (two) times daily., Disp: 30 g, Rfl: 0   icosapent  Ethyl (VASCEPA ) 1 g capsule, TAKE 2 CAPSULES TWICE A DAY, Disp: 360 capsule, Rfl: 1   lisinopril  (ZESTRIL ) 5 MG tablet,  TAKE 1 TABLET DAILY, Disp: 90 tablet, Rfl: 0   Multiple Vitamin (MULTIVITAMIN) tablet, Take 1 tablet by mouth daily., Disp: , Rfl:    Pseudoephedrine-APAP-DM (DAYQUIL PO), Take 2 capsules by mouth as needed., Disp: , Rfl:    rosuvastatin  (CRESTOR ) 20 MG tablet, TAKE 1 TABLET DAILY, Disp: 90 tablet, Rfl: 0   tiZANidine  (ZANAFLEX ) 4 MG tablet, Take 1 tablet (4 mg total) by mouth 2 (two) times daily as needed for muscle spasms., Disp: 20 tablet, Rfl: 0  Allergies  Allergen Reactions   Erythromycin     Age 46, unsure of allergy     Review of Systems Constitutional: -fever, -chills, -sweats, -unexpected weight change, -decreased appetite, -fatigue Allergy: -sneezing, -itching, -congestion Dermatology: -changing moles, --rash, -lumps ENT: -runny nose, -ear pain, -sore throat, -hoarseness, -sinus pain, -teeth pain, - ringing in ears, -hearing  loss, -nosebleeds Cardiology: -chest pain, -palpitations, -swelling, -difficulty breathing when lying flat, -waking up short of breath Respiratory: -cough, -shortness of breath, -difficulty breathing with exercise or exertion, -wheezing, -coughing up blood Gastroenterology: -abdominal pain, -nausea, -vomiting, -diarrhea, -constipation, -blood in stool, -changes in bowel movement, -difficulty swallowing or eating Hematology: -bleeding, -bruising  Musculoskeletal: -joint aches, -muscle aches, -joint swelling, -back pain, -neck pain, -cramping, -changes in gait Ophthalmology: denies vision changes, eye redness, itching, discharge Urology: -burning with urination, -difficulty urinating, -blood in urine, -urinary frequency, -urgency, -incontinence Neurology: -headache, -weakness, -tingling, -numbness, -memory loss, -falls, -dizziness Psychology: -depressed mood, -agitation, -sleep problems Male GU: no testicular mass, pain, no lymph nodes swollen, no swelling, no rash.      05/24/2023    8:06 AM 05/21/2022    8:31 AM 01/14/2022    1:40 PM 07/30/2021   12:36 PM 05/19/2021    8:22 AM  Depression screen PHQ 2/9  Decreased Interest 0 0 0 0 0  Down, Depressed, Hopeless 0 0 0 0 0  PHQ - 2 Score 0 0 0 0 0  Altered sleeping 0    0  Tired, decreased energy 0    0  Change in appetite 0    0  Feeling bad or failure about yourself  0    0  Trouble concentrating 0    0  Moving slowly or fidgety/restless 0    0  Suicidal thoughts 0    0  PHQ-9 Score 0    0  Difficult doing work/chores Not difficult at all    Not difficult at all        Objective:  BP 120/84   Pulse 91   Ht 6' (1.829 m)   Wt 187 lb 3.2 oz (84.9 kg)   BMI 25.39 kg/m   General appearance: alert, no distress, WD/WN, Caucasian male Skin: unremarkable, scorpion tattoo right inguinal region HEENT: normocephalic, conjunctiva/corneas normal, sclerae anicteric, PERRLA, EOMi, nares patent, no discharge or erythema, pharynx  normal Neck: supple, no lymphadenopathy, no thyromegaly, no masses, normal ROM, no bruits Chest: non tender, normal shape and expansion Heart: RRR, normal S1, S2, no murmurs Lungs: CTA bilaterally, no wheezes, rhonchi, or rales Abdomen: +bs, soft, non tender, non distended, no masses, no hepatomegaly, no splenomegaly, no bruits Back: non tender, normal ROM, no scoliosis Musculoskeletal: upper extremities non tender, no obvious deformity, normal ROM throughout, lower extremities non tender, no obvious deformity, normal ROM throughout Extremities: no edema, no cyanosis, no clubbing Pulses: 2+ symmetric, upper and lower extremities, normal cap refill Neurological: alert, oriented x 3, CN2-12 intact, strength normal upper extremities and lower extremities, sensation normal throughout,  DTRs 2+ throughout, no cerebellar signs, gait normal Psychiatric: normal affect, behavior normal, pleasant  GU/rectal - deferred    Assessment and Plan :   Encounter Diagnoses  Name Primary?   Routine general medical examination at a health care facility Yes   Family history of melanoma    Gastroesophageal reflux disease without esophagitis    Hyperlipidemia, unspecified hyperlipidemia type    Impaired fasting blood sugar    Single kidney    Screening for prostate cancer    Vaccine counseling    Prophylactic measure     This visit was a preventative care visit, also known as wellness visit or routine physical.   Topics typically include healthy lifestyle, diet, exercise, preventative care, vaccinations, sick and well care, proper use of emergency dept and after hours care, as well as other concerns.     Recommendations: Continue to return yearly for your annual wellness and preventative care visits.  This gives us  a chance to discuss healthy lifestyle, exercise, vaccinations, review your chart record, and perform screenings where appropriate.  I recommend you see your eye doctor yearly for routine  vision care.  I recommend you see your dentist yearly for routine dental care including hygiene visits twice yearly.   Vaccination recommendations were reviewed Immunization History  Administered Date(s) Administered   Hepatitis A, Adult 05/25/2017, 01/05/2018   Hepatitis B, ADULT 05/25/2017, 06/27/2017, 01/05/2018   Influenza Inj Mdck Quad With Preservative 04/08/2018   Influenza Split 02/21/2021   Influenza,inj,Quad PF,6+ Mos 03/22/2019, 03/22/2019   Influenza-Unspecified 03/18/2016, 03/13/2017, 04/08/2018, 02/27/2020, 02/25/2022, 03/02/2023   Janssen (J&J) SARS-COV-2 Vaccination 08/03/2019   PFIZER SARS-COV-2 Pediatric Vaccination 5-70yrs 03/17/2020   Pfizer Covid-19 Vaccine Bivalent Booster 10yrs & up 05/19/2021   Tdap 05/12/2018   Unspecified SARS-COV-2 Vaccination 02/27/2022    Consider shingrix and pneumococcal vaccine   Screening for cancer: Colon cancer screening: I reviewed your colonoscopy on file that is up to date from 2022  We discussed PSA, prostate exam, and prostate cancer screening risks/benefits.     Skin cancer screening: Check your skin regularly for new changes, growing lesions, or other lesions of concern Come in for evaluation if you have skin lesions of concern.  Lung cancer screening: If you have a greater than 20 pack year history of tobacco use, then you may qualify for lung cancer screening with a chest CT scan.   Please call your insurance company to inquire about coverage for this test.  We currently don't have screenings for other cancers besides breast, cervical, colon, and lung cancers.  If you have a strong family history of cancer or have other cancer screening concerns, please let me know.    Bone health: Get at least 150 minutes of aerobic exercise weekly Get weight bearing exercise at least once weekly Bone density test:  A bone density test is an imaging test that uses a type of X-ray to measure the amount of calcium  and other  minerals in your bones. The test may be used to diagnose or screen you for a condition that causes weak or thin bones (osteoporosis), predict your risk for a broken bone (fracture), or determine how well your osteoporosis treatment is working. The bone density test is recommended for females 65 and older, or females or males <65 if certain risk factors such as thyroid  disease, long term use of steroids such as for asthma or rheumatological issues, vitamin D  deficiency, estrogen deficiency, family history of osteoporosis, self or family history of fragility fracture in  first degree relative.    Heart health: Get at least 150 minutes of aerobic exercise weekly Limit alcohol It is important to maintain a healthy blood pressure and healthy cholesterol numbers  Heart disease screening: Screening for heart disease includes screening for blood pressure, fasting lipids, glucose/diabetes screening, BMI height to weight ratio, reviewed of smoking status, physical activity, and diet.    Goals include blood pressure 120/80 or less, maintaining a healthy lipid/cholesterol profile, preventing diabetes or keeping diabetes numbers under good control, not smoking or using tobacco products, exercising most days per week or at least 150 minutes per week of exercise, and eating healthy variety of fruits and vegetables, healthy oils, and avoiding unhealthy food choices like fried food, fast food, high sugar and high cholesterol foods.    Other tests may possibly include EKG test, CT coronary calcium  score, echocardiogram, exercise treadmill stress test.   Consider CT coronary test, $95 cash pay price   Medical care options: I recommend you continue to seek care here first for routine care.  We try really hard to have available appointments Monday through Friday daytime hours for sick visits, acute visits, and physicals.  Urgent care should be used for after hours and weekends for significant issues that cannot wait  till the next day.  The emergency department should be used for significant potentially life-threatening emergencies.  The emergency department is expensive, can often have long wait times for less significant concerns, so try to utilize primary care, urgent care, or telemedicine when possible to avoid unnecessary trips to the emergency department.  Virtual visits and telemedicine have been introduced since the pandemic started in 2020, and can be convenient ways to receive medical care.  We offer virtual appointments as well to assist you in a variety of options to seek medical care.   Advanced Directives: I recommend you consider completing a Health Care Power of Attorney and Living Will.   These documents respect your wishes and help alleviate burdens on your loved ones if you were to become terminally ill or be in a position to need those documents enforced.    You can complete Advanced Directives yourself, have them notarized, then have copies made for our office, for you and for anybody you feel should have them in safe keeping.  Or, you can have an attorney prepare these documents.   If you haven't updated your Last Will and Testament in a while, it may be worthwhile having an attorney prepare these documents together and save on some costs.       Separate significant issues discussed: Hypertension-continue current medication lisinporil 5mg  daily  Impaired glucose-updated labs today  Single kidney-avoid NSAIDs, avoid dehydration and kidney toxic drugs  Prophylactic measure, on Prep  Depression in remission - doing fine on current therapy, continue Wellbutrin   Hyperlipidemia - labs today, continue current medication Crestor  20mg  daily   Ekansh was seen today for annual exam.  Diagnoses and all orders for this visit:  Routine general medical examination at a health care facility -     Renal Function Panel -     Hepatic function panel -     Lipid panel -     PSA -     POCT  Urinalysis DIP (Proadvantage Device) -     Microalbumin/Creatinine Ratio, Urine -     Hemoglobin A1c -     CBC with Differential/Platelet -     HIV Antibody (routine testing w rflx)  Family history of melanoma  Gastroesophageal  reflux disease without esophagitis  Hyperlipidemia, unspecified hyperlipidemia type -     Lipid panel  Impaired fasting blood sugar -     Hemoglobin A1c  Single kidney -     Renal Function Panel -     POCT Urinalysis DIP (Proadvantage Device) -     Microalbumin/Creatinine Ratio, Urine  Screening for prostate cancer -     PSA  Vaccine counseling  Prophylactic measure -     HIV Antibody (routine testing w rflx)   Follow-up pending labs, yearly for physical

## 2023-05-26 LAB — MICROALBUMIN / CREATININE URINE RATIO
Creatinine, Urine: 52.9 mg/dL
Microalb/Creat Ratio: 6 mg/g{creat} (ref 0–29)
Microalbumin, Urine: 3.3 ug/mL

## 2023-05-26 LAB — PSA: Prostate Specific Ag, Serum: 0.3 ng/mL (ref 0.0–4.0)

## 2023-05-26 LAB — RENAL FUNCTION PANEL
Albumin: 4.8 g/dL (ref 3.8–4.9)
BUN/Creatinine Ratio: 14 (ref 9–20)
BUN: 17 mg/dL (ref 6–24)
CO2: 23 mmol/L (ref 20–29)
Calcium: 9.8 mg/dL (ref 8.7–10.2)
Chloride: 99 mmol/L (ref 96–106)
Creatinine, Ser: 1.18 mg/dL (ref 0.76–1.27)
Glucose: 106 mg/dL — ABNORMAL HIGH (ref 70–99)
Phosphorus: 2.6 mg/dL — ABNORMAL LOW (ref 2.8–4.1)
Potassium: 4.5 mmol/L (ref 3.5–5.2)
Sodium: 138 mmol/L (ref 134–144)
eGFR: 74 mL/min/{1.73_m2} (ref >59)

## 2023-05-26 LAB — LIPID PANEL
Cholesterol, Total: 199 mg/dL (ref 100–199)
HDL: 58 mg/dL (ref >39)
LDL CALC COMMENT:: 3.4 ratio (ref 0.0–5.0)
LDL Chol Calc (NIH): 113 mg/dL — ABNORMAL HIGH (ref 0–99)
Triglycerides: 161 mg/dL — ABNORMAL HIGH (ref 0–149)
VLDL Cholesterol Cal: 28 mg/dL (ref 5–40)

## 2023-05-26 LAB — CBC WITH DIFFERENTIAL/PLATELET
Basophils Absolute: 0 10*3/uL (ref 0.0–0.2)
Basos: 1 %
EOS (ABSOLUTE): 0 10*3/uL (ref 0.0–0.4)
Eos: 1 %
Hematocrit: 47.9 % (ref 37.5–51.0)
Hemoglobin: 16.5 g/dL (ref 13.0–17.7)
Immature Grans (Abs): 0 10*3/uL (ref 0.0–0.1)
Immature Granulocytes: 0 %
Lymphocytes Absolute: 1.2 10*3/uL (ref 0.7–3.1)
Lymphs: 27 %
MCH: 31.1 pg (ref 26.6–33.0)
MCHC: 34.4 g/dL (ref 31.5–35.7)
MCV: 90 fL (ref 79–97)
Monocytes Absolute: 0.4 10*3/uL (ref 0.1–0.9)
Monocytes: 9 %
Neutrophils Absolute: 2.7 10*3/uL (ref 1.4–7.0)
Neutrophils: 62 %
Platelets: 362 10*3/uL (ref 150–450)
RBC: 5.3 x10E6/uL (ref 4.14–5.80)
RDW: 12.8 % (ref 11.6–15.4)
WBC: 4.3 10*3/uL (ref 3.4–10.8)

## 2023-05-26 LAB — HEPATIC FUNCTION PANEL
ALT: 39 IU/L (ref 0–44)
AST: 37 IU/L (ref 0–40)
Alkaline Phosphatase: 82 IU/L (ref 44–121)
Bilirubin Total: 0.4 mg/dL (ref 0.0–1.2)
Bilirubin, Direct: 0.15 mg/dL (ref 0.00–0.40)
Total Protein: 7.3 g/dL (ref 6.0–8.5)

## 2023-05-26 LAB — HEMOGLOBIN A1C
Est. average glucose Bld gHb Est-mCnc: 120 mg/dL
Hgb A1c MFr Bld: 5.8 % — ABNORMAL HIGH (ref 4.8–5.6)

## 2023-05-26 LAB — HIV ANTIBODY (ROUTINE TESTING W REFLEX)

## 2023-05-26 NOTE — Progress Notes (Signed)
 Results sent through MyChart

## 2023-05-27 NOTE — Progress Notes (Signed)
 Results sent through MyChart

## 2023-06-06 ENCOUNTER — Other Ambulatory Visit: Payer: Self-pay | Admitting: Medical

## 2023-06-06 LAB — LAB REPORT - SCANNED: PSA, Total: <0.015

## 2023-06-21 ENCOUNTER — Ambulatory Visit: Payer: 59 | Admitting: Medical

## 2023-06-21 VITALS — BP 120/70 | HR 88 | Temp 97.1°F | Wt 188.6 lb

## 2023-06-21 DIAGNOSIS — J3489 Other specified disorders of nose and nasal sinuses: Secondary | ICD-10-CM

## 2023-06-21 DIAGNOSIS — J45901 Unspecified asthma with (acute) exacerbation: Secondary | ICD-10-CM | POA: Diagnosis not present

## 2023-06-21 MED ORDER — AMOXICILLIN-POT CLAVULANATE 875-125 MG PO TABS: 1.0000 | ORAL_TABLET | Freq: Two times a day (BID) | ORAL | 0 refills | Status: DC

## 2023-06-21 MED ORDER — ALBUTEROL SULFATE HFA 108 (90 BASE) MCG/ACT IN AERS: 2.0000 | INHALATION_SPRAY | Freq: Four times a day (QID) | RESPIRATORY_TRACT | 1 refills | Status: AC | PRN

## 2023-06-21 MED ORDER — PREDNISONE 10 MG PO TABS: ORAL_TABLET | ORAL | 0 refills | Status: DC

## 2023-06-21 NOTE — Progress Notes (Signed)
Subjective:  Chase Rogers is a 53 y.o. male who presents for Chief Complaint  Patient presents with   possible sinus infection    Going to [place] for work this weekend and doesn't want his symptoms to turn into a sinus infection, symptoms- head congestion, cough, runny nose, some SOB- using inhaler, symptoms started 1.5 weeks ago. Covid negative a week ago     Here for 1.5 week hx/o sinus congestion.  Been dealing with it, using OTC remedies, but is flying to [place] this weekend and trying to get this to resolve.  He notes head congestion, sinus pressure, runny nose, cough, some congestion, SOB and wheezing.   No fever, no body ache or chills.  No blood tinged mucous.  Having some milky colored purulent discharge with blowing nose and cough.  Using decongestant with some relief but not much.  Using inhaler quite a bit in past week.   No sore throat.   No other aggravating or relieving factors.    No other c/o.  The following portions of the patient's history were reviewed and updated as appropriate: allergies, current medications, past family history, past medical history, past social history, past surgical history and problem list.  ROS Otherwise as in subjective above    Objective: BP 120/70   Pulse 88   Temp (!) 97.1 F (36.2 C)   Wt 188 lb 9.6 oz (85.5 kg)   BMI 25.58 kg/m   General appearance: alert, no distress, well developed, well nourished HEENT: normocephalic, sclerae anicteric, conjunctiva pink and moist, TMs flat, nares with mucoid discharge +erythema, pharynx with mild erythema and some post nasal drainage Oral cavity: MMM, no lesions Neck: supple, no lymphadenopathy, no thyromegaly, no masses Heart: RRR, normal S1, S2, no murmurs Lungs: CTA bilaterally, no wheezes, rhonchi, or rales   Assessment: Encounter Diagnoses  Name Primary?   Sinus pressure Yes   Purulent nasal discharge    Reactive airway disease with acute exacerbation, unspecified asthma  severity, unspecified whether persistent      Plan: Continue with good water intake throughout the day.  Advised he limit Sudafed to 60 mg twice a day or consider changing Mucinex short-term over-the-counter oral tablet.  Hold off on the Mucinex nasal spray topical decongestant for a few days and just use Flonase in the morning.  He can use nasal saline flush in the evening.  If he needs to use back the Mucinex topical decongestant nasal spray right before his trip and during the air travel that is fine but use this for 3 days or less.  Begin antibiotic below.  Refilled albuterol, continue albuterol as needed 2 puffs every 4-6 hours.  If not really seeing improvements over the next 48 hours can add steroid as well prednisone.  This is a backup option if things do not seeming to improve.  Chase Rogers was seen today for possible sinus infection.  Diagnoses and all orders for this visit:  Sinus pressure  Purulent nasal discharge  Reactive airway disease with acute exacerbation, unspecified asthma severity, unspecified whether persistent  Other orders -     albuterol (VENTOLIN HFA) 108 (90 Base) MCG/ACT inhaler; Inhale 2 puffs into the lungs every 6 (six) hours as needed for wheezing or shortness of breath. -     amoxicillin-clavulanate (AUGMENTIN) 875-125 MG tablet; Take 1 tablet by mouth 2 (two) times daily. -     predniSONE (DELTASONE) 10 MG tablet; 6 tablets all together day 1, 5 tablets day 2, 4 tablets day 3,  3 tablets day 4, 2 tablets day 5, 1 tablet day 6.    Follow up: prn

## 2023-06-27 ENCOUNTER — Other Ambulatory Visit: Payer: Self-pay | Admitting: Medical

## 2023-06-27 MED ORDER — BUDESONIDE-FORMOTEROL FUMARATE 160-4.5 MCG/ACT IN AERO: 2.0000 | INHALATION_SPRAY | Freq: Two times a day (BID) | RESPIRATORY_TRACT | 2 refills | Status: AC

## 2023-06-28 ENCOUNTER — Other Ambulatory Visit (HOSPITAL_COMMUNITY): Payer: Self-pay

## 2023-06-28 ENCOUNTER — Telehealth: Payer: Self-pay

## 2023-06-28 NOTE — Telephone Encounter (Signed)
 Pharmacy Patient Advocate Encounter   Received notification from CoverMyMeds that prior authorization for Vascepa  1GM capsules is required/requested.   Insurance verification completed.   The patient is insured through [employer] .   Per test claim: PA required; PA submitted to above mentioned insurance via CoverMyMeds Key/confirmation #/EOC (Key: [id number])     Status is pending

## 2023-06-30 ENCOUNTER — Other Ambulatory Visit (HOSPITAL_COMMUNITY): Payer: Self-pay

## 2023-06-30 NOTE — Telephone Encounter (Signed)
 Pharmacy Patient Advocate Encounter  Received notification from [employer] that Prior Authorization for  Vascepa  1GM capsules  has been APPROVED from 1.5.25 to 2.4.26. Ran test claim, Copay is $12.00. This test claim was processed through [facility]- copay amounts may vary at other pharmacies due to pharmacy/plan contracts, or as the patient moves through the different stages of their insurance plan.

## 2023-07-01 ENCOUNTER — Other Ambulatory Visit: Payer: Self-pay | Admitting: Medical

## 2023-07-20 ENCOUNTER — Other Ambulatory Visit: Payer: Self-pay | Admitting: Medical

## 2023-09-16 ENCOUNTER — Ambulatory Visit: Admitting: Medical

## 2023-09-16 VITALS — BP 118/82 | HR 77 | Wt 188.6 lb

## 2023-09-16 DIAGNOSIS — M25511 Pain in right shoulder: Secondary | ICD-10-CM

## 2023-09-16 DIAGNOSIS — M7521 Bicipital tendinitis, right shoulder: Secondary | ICD-10-CM

## 2023-09-16 DIAGNOSIS — L729 Follicular cyst of the skin and subcutaneous tissue, unspecified: Secondary | ICD-10-CM

## 2023-09-16 DIAGNOSIS — G8929 Other chronic pain: Secondary | ICD-10-CM

## 2023-09-16 NOTE — Progress Notes (Signed)
 Subjective:  Chase Rogers is a 53 y.o. male who presents for Chief Complaint  Patient presents with   Acute Visit    Pain in right shoulder. Lump on neck as been there for a while kept forgetting to say anything.      Here for right shoulder pain.  Worse when lifting weights.  Pain for months or more.   If bench press, more and more difficult to do this activity.  Has been using lower weights, but still hurting a lot.  Has hx/o left shoulder issues.  Has bone spur and rotator cuff tear in 2022 with left shoulder, Dr. Deeann Rogers did surgery on that one.  No numbness or tingling in the arm.   Sometimes pain on right shoulder if sleeping on that side.    Using nothing for it.    Feels a lump in left neck he wants looked at.  Been there a long time but forgets to mention it.  No other aggravating or relieving factors.    No other c/o.  Past Medical History:  Diagnosis Date   Allergy    Anxiety    Congenital single kidney    per prior ultrasound and other imaging   Depression    Depression 05/03/2016   Depression, major, single episode, in partial remission (HCC) 05/14/2019   GERD (gastroesophageal reflux disease)    High triglycerides    History of EKG 2016   normal per report   Hyperlipidemia    Hypertension 2004   Migraine    Renal agenesis    Wart 2014   finger   Wears contact lenses    Past Surgical History:  Procedure Laterality Date   COLONOSCOPY  2012   abdominal pain, brother hx/o Crohns.  normal per patient   COLONOSCOPY W/ BIOPSIES  11/2020   KNEE SURGERY  2007   right meniscal tear   MINOR FULGERATION OF ANAL CONDYLOMA  2013   SHOULDER ARTHROSCOPY Left 11/2020   WISDOM TOOTH EXTRACTION      The following portions of the patient's history were reviewed and updated as appropriate: allergies, current medications, past family history, past medical history, past social history, past surgical history and problem list.   ROS Otherwise as in subjective  above      Objective: BP 118/82   Pulse 77   Wt 188 lb 9.6 oz (85.5 kg)   BMI 25.58 kg/m   General appearance: alert, no distress, well developed, well nourished Skin: Left anterior neck below the [place] approximately 1 cm diameter subcutaneous cystic lesion.  No obvious thyroid  nodules or thyroid  enlargement.  No other lymphadenopathy or other mass noted. Neck: supple, no lymphadenopathy, no thyromegaly, no masses, normal range of motion, nontender MSK: Tender over the right biceps origin, tender over the [facility] joint, pain noted with crossover test and apprehension test, mild pain with range of motion in general but no obvious weakness or laxity, no obvious pain with empty can test or labral test, rest of alignment unremarkable and nontender without swelling or other deformity.  Rest of the arms unremarkable Upper back nontender Arms neurovascularly intact   Assessment: Encounter Diagnoses  Name Primary?   Chronic right shoulder pain Yes   Skin cyst    Biceps tendinitis of right upper extremity      Plan: We discussed the shoulder findings that suggest bicep tendinitis, possibly from rotator cuff inflammation, possible impingement as well.  He has a history of left shoulder surgery.  Referral back to Dr.  Deeann Rogers who saw him for his left shoulder as he will need imaging and possible intervention.  In the meantime can use cold therapy on and off, arm sling to help rest periodically, Tylenol  if needed for pain.  We will avoid NSAIDs given solitary kidney.  Skin cyst left anterior neck-we discussed the skin findings unchanged for years.  No obvious sign of lymph node or lymphoma or worrisome mass.  If desired we can refer to plastic surgery.  He will leave it alone for now.  [name] was seen today for acute visit.  Diagnoses and all orders for this visit:  Chronic right shoulder pain -     Ambulatory referral to Orthopedic Surgery  Skin cyst  Biceps tendinitis of right upper  extremity    Follow up: prn

## 2023-09-22 ENCOUNTER — Other Ambulatory Visit: Payer: Self-pay | Admitting: Medical

## 2023-09-22 MED ORDER — TERBINAFINE HCL 250 MG PO TABS: 250.0000 mg | ORAL_TABLET | Freq: Every day | ORAL | 0 refills | Status: DC

## 2023-10-21 ENCOUNTER — Other Ambulatory Visit: Payer: Self-pay | Admitting: Medical

## 2023-11-14 ENCOUNTER — Ambulatory Visit: Admitting: Medical

## 2023-11-14 ENCOUNTER — Encounter: Payer: Self-pay | Admitting: Medical

## 2023-11-14 VITALS — BP 130/68 | HR 85 | Ht 72.0 in | Wt 199.6 lb

## 2023-11-14 DIAGNOSIS — Z79899 Other long term (current) drug therapy: Secondary | ICD-10-CM | POA: Diagnosis not present

## 2023-11-14 DIAGNOSIS — K649 Unspecified hemorrhoids: Secondary | ICD-10-CM | POA: Diagnosis not present

## 2023-11-14 DIAGNOSIS — K6289 Other specified diseases of anus and rectum: Secondary | ICD-10-CM

## 2023-11-14 DIAGNOSIS — B351 Tinea unguium: Secondary | ICD-10-CM

## 2023-11-14 LAB — COMPREHENSIVE METABOLIC PANEL WITH GFR
ALT: 40 IU/L (ref 0–44)
AST: 38 IU/L (ref 0–40)
Albumin: 5 g/dL — ABNORMAL HIGH (ref 3.8–4.9)
Alkaline Phosphatase: 88 IU/L (ref 44–121)
BUN/Creatinine Ratio: 17 (ref 9–20)
BUN: 18 mg/dL (ref 6–24)
Bilirubin Total: 0.3 mg/dL (ref 0.0–1.2)
CO2: 21 mmol/L (ref 20–29)
Calcium: 10.1 mg/dL (ref 8.7–10.2)
Chloride: 99 mmol/L (ref 96–106)
Creatinine, Ser: 1.03 mg/dL (ref 0.76–1.27)
Globulin, Total: 2.4 g/dL (ref 1.5–4.5)
Glucose: 93 mg/dL (ref 70–99)
Potassium: 4.9 mmol/L (ref 3.5–5.2)
Sodium: 137 mmol/L (ref 134–144)
Total Protein: 7.4 g/dL (ref 6.0–8.5)
eGFR: 87 mL/min/{1.73_m2} (ref >59)

## 2023-11-14 MED ORDER — TERBINAFINE HCL 250 MG PO TABS: 250.0000 mg | ORAL_TABLET | Freq: Every day | ORAL | 1 refills | Status: DC

## 2023-11-14 NOTE — Progress Notes (Signed)
 Subjective:  Chase Rogers is a 53 y.o. male who presents for Chief Complaint  Patient presents with   Follow-up    Hemorrhoids     Here for complaint of hemorrhoids.  Sometimes hemorrhoids are painful awakening him in his sleep.  Sometimes stabbing pain, but mostly in the night randomly.  Not steady pain all the time.  Using metamucil daily to maximize fiber in diet.  Has multiple daily BMs, 5-6 BMs daily,  normal texture, not loose or hard.  No blood in stool.  Using suppositories and external cream.   Eats typically 2 meals daily and snacks a few times daily.   Caffeine is limited.   Drinks a good amount of water daily.   Does some weight lifting but more lighter weight higher repetition.   Does exercise bike vs treadmill or elliptical.     No pain with wiping typically but pain aggravates in night.     Here for f/u on Lamisil  oral medication.   Has completed a month of Lamisil  oral for toenail fungus.  Foot was also scaly prior, and that has improved on lamisil .   Last lamisil  dose about a month ago.  No other aggravating or relieving factors.    No other c/o.  Past Medical History:  Diagnosis Date   Allergy    Anxiety    Congenital single kidney    per prior ultrasound and other imaging   Depression    Depression 05/03/2016   Depression, major, single episode, in partial remission (HCC) 05/14/2019   GERD (gastroesophageal reflux disease)    High triglycerides    History of EKG 2016   normal per report   Hyperlipidemia    Hypertension 2004   Migraine    Renal agenesis    Wart 2014   finger   Wears contact lenses    Current Outpatient Medications on File Prior to Visit  Medication Sig Dispense Refill   albuterol  (VENTOLIN  HFA) 108 (90 Base) MCG/ACT inhaler Inhale 2 puffs into the lungs every 6 (six) hours as needed for wheezing or shortness of breath. 8.5 each 1   budesonide -formoterol  (SYMBICORT ) 160-4.5 MCG/ACT inhaler Inhale 2 puffs into the lungs 2 (two) times daily.  (Patient taking differently: Inhale 2 puffs into the lungs as needed.) 10.2 g 2   buPROPion  (WELLBUTRIN  SR) 150 MG 12 hr tablet TAKE 1 TABLET TWICE A DAY 180 tablet 1   eletriptan  (RELPAX ) 20 MG tablet Take 1 tablet (20 mg total) by mouth as needed for migraine or headache. May repeat in 2 hours if headache persists or recurs. 10 tablet 1   esomeprazole (NEXIUM) 20 MG capsule Take 20 mg by mouth daily at 12 noon.     hydrocortisone  (ANUSOL -HC) 25 MG suppository PLACE 1 SUPPOSITORY RECTALLY 2 TIMES DAILY. 30 suppository 1   hydrocortisone  2.5 % cream Apply topically 2 (two) times daily. 30 g 0   icosapent  Ethyl (VASCEPA ) 1 g capsule TAKE 2 CAPSULES TWICE A DAY 360 capsule 1   lisinopril  (ZESTRIL ) 5 MG tablet TAKE 1 TABLET DAILY 90 tablet 3   Multiple Vitamin (MULTIVITAMIN) tablet Take 1 tablet by mouth daily.     rosuvastatin  (CRESTOR ) 20 MG tablet TAKE 1 TABLET DAILY 90 tablet 1   No current facility-administered medications on file prior to visit.     The following portions of the patient's history were reviewed and updated as appropriate: allergies, current medications, past family history, past medical history, past social history, past surgical history and  problem list.  ROS Otherwise as in subjective above    Objective: BP 130/68   Pulse 85   Ht 6' (1.829 m)   Wt 199 lb 9.6 oz (90.5 kg)   SpO2 95%   BMI 27.07 kg/m   General appearance: alert, no distress, well developed, well nourished Left foot with several toes with yellow brown coloration including great toe and 5th toe.  Great toenail on left with about half nail missing on medial side, thickened great toenail as well.   No other abnormal foot findings Feet with normal pulses and cap refill Rectal - there appears to be a bulging internal hemorrhoid noted at anus, no distinct erythema, no obvious fissure, otherwise no other external hemorrhoids or tendnerss on exam    Assessment: Encounter Diagnoses  Name Primary?    Onychomycosis Yes   High risk medication use    Hemorrhoids, unspecified hemorrhoid type    Rectal pain      Plan: Onychomycosis - updated lab today for medication surveillance.   Plan to continue oral Lamisil  1-2 more months  High risk medication, lamisil  oral, updated labs today for surveillance  Hemorrhoids, rectal pain - I reviewed back over his 2022 colonoscopy report showing internal and external hemorrhoids.  Consider smaller portions overall, continue fiber and good water intake.   Continue hydrocortisone  cream and /or suppository as needed.   Refer to Dr. Belvie Just GI as Dr. Eda is no longer in practice in the area.      Chase Rogers was seen today for follow-up.  Diagnoses and all orders for this visit:  Onychomycosis -     Comprehensive metabolic panel with GFR  High risk medication use -     Comprehensive metabolic panel with GFR  Hemorrhoids, unspecified hemorrhoid type -     Ambulatory referral to Gastroenterology  Rectal pain -     Ambulatory referral to Gastroenterology  Other orders -     terbinafine  (LAMISIL ) 250 MG tablet; Take 1 tablet (250 mg total) by mouth daily.   Follow up: pending lab, call back

## 2023-11-15 ENCOUNTER — Ambulatory Visit: Payer: Self-pay | Admitting: Medical

## 2023-11-15 NOTE — Progress Notes (Signed)
 Results sent through MyChart

## 2023-11-18 ENCOUNTER — Telehealth: Admitting: Physician Assistant

## 2023-11-18 DIAGNOSIS — B9689 Other specified bacterial agents as the cause of diseases classified elsewhere: Secondary | ICD-10-CM

## 2023-11-18 DIAGNOSIS — J019 Acute sinusitis, unspecified: Secondary | ICD-10-CM | POA: Diagnosis not present

## 2023-11-18 MED ORDER — AMOXICILLIN-POT CLAVULANATE 875-125 MG PO TABS: 1.0000 | ORAL_TABLET | Freq: Two times a day (BID) | ORAL | 0 refills | Status: DC

## 2023-11-18 NOTE — Progress Notes (Signed)
 Virtual Visit Consent   [name], you are scheduled for a virtual visit with a [facility] provider today. Just as with appointments in the office, your consent must be obtained to participate. Your consent will be active for this visit and any virtual visit you may have with one of our providers in the next 365 days. If you have a MyChart account, a copy of this consent can be sent to you electronically.  As this is a virtual visit, video technology does not allow for your provider to perform a traditional examination. This may limit your provider's ability to fully assess your condition. If your provider identifies any concerns that need to be evaluated in person or the need to arrange testing (such as labs, EKG, etc.), we will make arrangements to do so. Although advances in technology are sophisticated, we cannot ensure that it will always work on either your end or our end. If the connection with a video visit is poor, the visit may have to be switched to a telephone visit. With either a video or telephone visit, we are not always able to ensure that we have a secure connection.  By engaging in this virtual visit, you consent to the provision of healthcare and authorize for your insurance to be billed (if applicable) for the services provided during this visit. Depending on your insurance coverage, you may receive a charge related to this service.  I need to obtain your verbal consent now. Are you willing to proceed with your visit today? Chase Rogers has provided verbal consent on 11/18/2023 for a virtual visit (video or telephone). Chase CHRISTELLA Dickinson, PA-C  Date: 11/18/2023 4:20 PM   Virtual Visit via Video Note   IDelon CHRISTELLA Rogers, connected with  Chase Rogers  ([id number], 1971/01/11) on 11/18/23 at  4:15 PM EDT by a video-enabled telemedicine application and verified that I am speaking with the correct person using two identifiers.  Location: Patient: Virtual Visit  Location Patient: Home Provider: Virtual Visit Location Provider: Home Office   I discussed the limitations of evaluation and management by telemedicine and the availability of in person appointments. The patient expressed understanding and agreed to proceed.    History of Present Illness: Chase Rogers is a 53 y.o. who identifies as a male who was assigned male at birth, and is being seen today for sinus congestion and cough.  HPI: URI  This is a new problem. The current episode started 1 to 4 weeks ago (just over a week). The problem has been gradually worsening. There has been no fever. Associated symptoms include congestion, coughing (dry), headaches, rhinorrhea (and post nasal drainage), sinus pain, a sore throat (scratchy throat) and wheezing. Pertinent negatives include no chest pain, diarrhea, ear pain, nausea, plugged ear sensation or vomiting. He has tried inhaler use (mucinex nasal spray, sudafed, coricidin hbp) for the symptoms. The treatment provided no relief.      Problems:  Patient Active Problem List   Diagnosis Date Noted   Impaired fasting blood sugar 05/21/2022   Prophylactic measure 05/21/2022   Acute non-recurrent pansinusitis 05/11/2022   Hepatic cyst 05/14/2020   Chronic right hip pain 05/14/2020   Chronic left shoulder pain 05/14/2020   Depression, major, in remission (HCC) 05/14/2020   Trochanteric bursitis of right hip 08/02/2019   Screening for prostate cancer 05/14/2019   Screen for STD (sexually transmitted disease) 05/14/2019   Elevated pulse rate 05/14/2019   Onychomycosis 05/14/2019   Depression, major, single episode, in partial  remission (HCC) 05/14/2019   Sleep disturbance 11/15/2018   Work stress 11/15/2018   History of syphilis 05/12/2018   Family history of melanoma 05/12/2018   Single kidney 05/12/2018   Need for Tdap vaccination 05/12/2018   Exposure to communicable disease 05/11/2017   Essential hypertension, benign 05/03/2016    Hyperlipidemia 05/03/2016   Vaccine counseling 05/03/2016   Gastroesophageal reflux disease without esophagitis 05/03/2016   Routine general medical examination at a health care facility 05/03/2016    Allergies:  Allergies  Allergen Reactions   Erythromycin     Age 53, unsure of allergy   Medications:  Current Outpatient Medications:    amoxicillin -clavulanate (AUGMENTIN ) 875-125 MG tablet, Take 1 tablet by mouth 2 (two) times daily., Disp: 20 tablet, Rfl: 0   albuterol  (VENTOLIN  HFA) 108 (90 Base) MCG/ACT inhaler, Inhale 2 puffs into the lungs every 6 (six) hours as needed for wheezing or shortness of breath., Disp: 8.5 each, Rfl: 1   budesonide -formoterol  (SYMBICORT ) 160-4.5 MCG/ACT inhaler, Inhale 2 puffs into the lungs 2 (two) times daily. (Patient taking differently: Inhale 2 puffs into the lungs as needed.), Disp: 10.2 g, Rfl: 2   buPROPion  (WELLBUTRIN  SR) 150 MG 12 hr tablet, TAKE 1 TABLET TWICE A DAY, Disp: 180 tablet, Rfl: 1   eletriptan  (RELPAX ) 20 MG tablet, Take 1 tablet (20 mg total) by mouth as needed for migraine or headache. May repeat in 2 hours if headache persists or recurs., Disp: 10 tablet, Rfl: 1   esomeprazole (NEXIUM) 20 MG capsule, Take 20 mg by mouth daily at 12 noon., Disp: , Rfl:    hydrocortisone  (ANUSOL -HC) 25 MG suppository, PLACE 1 SUPPOSITORY RECTALLY 2 TIMES DAILY., Disp: 30 suppository, Rfl: 1   hydrocortisone  2.5 % cream, Apply topically 2 (two) times daily., Disp: 30 g, Rfl: 0   icosapent  Ethyl (VASCEPA ) 1 g capsule, TAKE 2 CAPSULES TWICE A DAY, Disp: 360 capsule, Rfl: 1   lisinopril  (ZESTRIL ) 5 MG tablet, TAKE 1 TABLET DAILY, Disp: 90 tablet, Rfl: 3   Multiple Vitamin (MULTIVITAMIN) tablet, Take 1 tablet by mouth daily., Disp: , Rfl:    rosuvastatin  (CRESTOR ) 20 MG tablet, TAKE 1 TABLET DAILY, Disp: 90 tablet, Rfl: 1   terbinafine  (LAMISIL ) 250 MG tablet, Take 1 tablet (250 mg total) by mouth daily., Disp: 30 tablet, Rfl:  1  Observations/Objective: Patient is well-developed, well-nourished in no acute distress.  Resting comfortably at home.  Head is normocephalic, atraumatic.  No labored breathing.  Speech is clear and coherent with logical content.  Patient is alert and oriented at baseline.    Assessment and Plan: 1. Acute bacterial sinusitis (Primary) - amoxicillin -clavulanate (AUGMENTIN ) 875-125 MG tablet; Take 1 tablet by mouth 2 (two) times daily.  Dispense: 20 tablet; Refill: 0  - Worsening symptoms that have not responded to OTC medications.  - Will give Augmentin  - Continue inhalers as prescribed - Continue allergy medications.  - Steam and humidifier can help - Stay well hydrated and get plenty of rest.  - Seek in person evaluation if no symptom improvement or if symptoms worsen   Follow Up Instructions: I discussed the assessment and treatment plan with the patient. The patient was provided an opportunity to ask questions and all were answered. The patient agreed with the plan and demonstrated an understanding of the instructions.  A copy of instructions were sent to the patient via MyChart unless otherwise noted below.    The patient was advised to call back or seek an in-person evaluation if  the symptoms worsen or if the condition fails to improve as anticipated.    Chase CHRISTELLA Dickinson, PA-C

## 2023-11-18 NOTE — Patient Instructions (Signed)
 Chase Rogers, thank you for joining [name], PA-C for today's virtual visit.  While this provider is not your primary care provider (PCP), if your PCP is located in our provider database this encounter information will be shared with them immediately following your visit.   A [facility] MyChart account gives you access to today's visit and all your visits, tests, and labs performed at [facility]  click here if you don't have a [facility] MyChart account or go to [email or web address]  Consent: (Patient) Chase Rogers provided verbal consent for this virtual visit at the beginning of the encounter.  Current Medications:  Current Outpatient Medications:    amoxicillin -clavulanate (AUGMENTIN ) 875-125 MG tablet, Take 1 tablet by mouth 2 (two) times daily., Disp: 20 tablet, Rfl: 0   albuterol  (VENTOLIN  HFA) 108 (90 Base) MCG/ACT inhaler, Inhale 2 puffs into the lungs every 6 (six) hours as needed for wheezing or shortness of breath., Disp: 8.5 each, Rfl: 1   budesonide -formoterol  (SYMBICORT ) 160-4.5 MCG/ACT inhaler, Inhale 2 puffs into the lungs 2 (two) times daily. (Patient taking differently: Inhale 2 puffs into the lungs as needed.), Disp: 10.2 g, Rfl: 2   buPROPion  (WELLBUTRIN  SR) 150 MG 12 hr tablet, TAKE 1 TABLET TWICE A DAY, Disp: 180 tablet, Rfl: 1   eletriptan  (RELPAX ) 20 MG tablet, Take 1 tablet (20 mg total) by mouth as needed for migraine or headache. May repeat in 2 hours if headache persists or recurs., Disp: 10 tablet, Rfl: 1   esomeprazole (NEXIUM) 20 MG capsule, Take 20 mg by mouth daily at 12 noon., Disp: , Rfl:    hydrocortisone  (ANUSOL -HC) 25 MG suppository, PLACE 1 SUPPOSITORY RECTALLY 2 TIMES DAILY., Disp: 30 suppository, Rfl: 1   hydrocortisone  2.5 % cream, Apply topically 2 (two) times daily., Disp: 30 g, Rfl: 0   icosapent  Ethyl (VASCEPA ) 1 g capsule, TAKE 2 CAPSULES TWICE A DAY, Disp: 360 capsule, Rfl: 1   lisinopril  (ZESTRIL ) 5 MG  tablet, TAKE 1 TABLET DAILY, Disp: 90 tablet, Rfl: 3   Multiple Vitamin (MULTIVITAMIN) tablet, Take 1 tablet by mouth daily., Disp: , Rfl:    rosuvastatin  (CRESTOR ) 20 MG tablet, TAKE 1 TABLET DAILY, Disp: 90 tablet, Rfl: 1   terbinafine  (LAMISIL ) 250 MG tablet, Take 1 tablet (250 mg total) by mouth daily., Disp: 30 tablet, Rfl: 1   Medications ordered in this encounter:  Meds ordered this encounter  Medications   amoxicillin -clavulanate (AUGMENTIN ) 875-125 MG tablet    Sig: Take 1 tablet by mouth 2 (two) times daily.    Dispense:  20 tablet    Refill:  0    Supervising Provider:   LAMPTEY, PHILIP O [[id number]]     *If you need refills on other medications prior to your next appointment, please contact your pharmacy*  Follow-Up: Call back or seek an in-person evaluation if the symptoms worsen or if the condition fails to improve as anticipated.  [facility] [phone]  Other Instructions  Sinus Infection, Adult A sinus infection, also called sinusitis, is inflammation of your sinuses. Sinuses are hollow spaces in the bones around your face. Your sinuses are located: Around your eyes. In the middle of your forehead. Behind your nose. In your cheekbones. Mucus normally drains out of your sinuses. When your nasal tissues become inflamed or swollen, mucus can become trapped or blocked. This allows bacteria, viruses, and fungi to grow, which leads to infection. Most infections of the sinuses are caused by a virus. A sinus  infection can develop quickly. It can last for up to 4 weeks (acute) or for more than 12 weeks (chronic). A sinus infection often develops after a cold. What are the causes? This condition is caused by anything that creates swelling in the sinuses or stops mucus from draining. This includes: Allergies. Asthma. Infection from bacteria or viruses. Deformities or blockages in your nose or sinuses. Abnormal growths in the nose (nasal polyps). Pollutants,  such as chemicals or irritants in the air. Infection from fungi. This is rare. What increases the risk? You are more likely to develop this condition if you: Have a weak body defense system (immune system). Do a lot of swimming or diving. Overuse nasal sprays. Smoke. What are the signs or symptoms? The main symptoms of this condition are pain and a feeling of pressure around the affected sinuses. Other symptoms include: Stuffy nose or congestion that makes it difficult to breathe through your nose. Thick yellow or greenish drainage from your nose. Tenderness, swelling, and warmth over the affected sinuses. A cough that may get worse at night. Decreased sense of smell and taste. Extra mucus that collects in the throat or the back of the nose (postnasal drip) causing a sore throat or bad breath. Tiredness (fatigue). Fever. How is this diagnosed? This condition is diagnosed based on: Your symptoms. Your medical history. A physical exam. Tests to find out if your condition is acute or chronic. This may include: Checking your nose for nasal polyps. Viewing your sinuses using a device that has a light (endoscope). Testing for allergies or bacteria. Imaging tests, such as an MRI or CT scan. In rare cases, a bone biopsy may be done to rule out more serious types of fungal sinus disease. How is this treated? Treatment for a sinus infection depends on the cause and whether your condition is chronic or acute. If caused by a virus, your symptoms should go away on their own within 10 days. You may be given medicines to relieve symptoms. They include: Medicines that shrink swollen nasal passages (decongestants). A spray that eases inflammation of the nostrils (topical intranasal corticosteroids). Rinses that help get rid of thick mucus in your nose (nasal saline washes). Medicines that treat allergies (antihistamines). Over-the-counter pain relievers. If caused by bacteria, your health care  provider may recommend waiting to see if your symptoms improve. Most bacterial infections will get better without antibiotic medicine. You may be given antibiotics if you have: A severe infection. A weak immune system. If caused by narrow nasal passages or nasal polyps, surgery may be needed. Follow these instructions at home: Medicines Take, use, or apply over-the-counter and prescription medicines only as told by your health care provider. These may include nasal sprays. If you were prescribed an antibiotic medicine, take it as told by your health care provider. Do not stop taking the antibiotic even if you start to feel better. Hydrate and humidify  Drink enough fluid to keep your urine pale yellow. Staying hydrated will help to thin your mucus. Use a cool mist humidifier to keep the humidity level in your home above 50%. Inhale steam for 10-15 minutes, 3-4 times a day, or as told by your health care provider. You can do this in the bathroom while a hot shower is running. Limit your exposure to cool or dry air. Rest Rest as much as possible. Sleep with your head raised (elevated). Make sure you get enough sleep each night. General instructions  Apply a warm, moist washcloth to your  face 3-4 times a day or as told by your health care provider. This will help with discomfort. Use nasal saline washes as often as told by your health care provider. Wash your hands often with soap and water to reduce your exposure to germs. If soap and water are not available, use hand sanitizer. Do not smoke. Avoid being around people who are smoking (secondhand smoke). Keep all follow-up visits. This is important. Contact a health care provider if: You have a fever. Your symptoms get worse. Your symptoms do not improve within 10 days. Get help right away if: You have a severe headache. You have persistent vomiting. You have severe pain or swelling around your face or eyes. You have vision  problems. You develop confusion. Your neck is stiff. You have trouble breathing. These symptoms may be an emergency. Get help right away. Call 911. Do not wait to see if the symptoms will go away. Do not drive yourself to the hospital. Summary A sinus infection is soreness and inflammation of your sinuses. Sinuses are hollow spaces in the bones around your face. This condition is caused by nasal tissues that become inflamed or swollen. The swelling traps or blocks the flow of mucus. This allows bacteria, viruses, and fungi to grow, which leads to infection. If you were prescribed an antibiotic medicine, take it as told by your health care provider. Do not stop taking the antibiotic even if you start to feel better. Keep all follow-up visits. This is important. This information is not intended to replace advice given to you by your health care provider. Make sure you discuss any questions you have with your health care provider. Document Revised: 04/14/2021 Document Reviewed: 04/14/2021 Elsevier Patient Education  2024 Elsevier Inc.   If you have been instructed to have an in-person evaluation today at a local Urgent Care facility, please use the link below. It will take you to a list of all of our available [facility], including address, phone number and hours of operation. Please do not delay care.  [facility]  If you or a family member do not have a primary care provider, use the link below to schedule a visit and establish care. When you choose a [facility] primary care physician or advanced practice provider, you gain a long-term partner in health. Find a Primary Care Provider  Learn more about [facility] in-office and virtual care options:  - Get Care Now

## 2023-12-19 ENCOUNTER — Ambulatory Visit (INDEPENDENT_AMBULATORY_CARE_PROVIDER_SITE_OTHER): Admitting: Medical

## 2023-12-19 VITALS — BP 120/80 | HR 94 | Wt 189.4 lb

## 2023-12-19 DIAGNOSIS — S93491D Sprain of other ligament of right ankle, subsequent encounter: Secondary | ICD-10-CM | POA: Diagnosis not present

## 2023-12-19 NOTE — Progress Notes (Signed)
 Subjective:  Chase Rogers is a 53 y.o. male who presents for Chief Complaint  Patient presents with   Acute Visit    Follow-up on fall. Fall on 7/15 and sprained right Ankle, still very sore and pain when walking. Some swelling     Here for right ankle injury.  He fell 2 weeks ago.  He was coming down the stairs in his house holding his dog in his coffee.  He missed the last step and twisted his right ankle laterally.  He also fell onto his leg and knee.  His knee is doing okay.  He went to urgent care that same day and had an x-ray at [facility] that was reportedly normal.  2 weeks out now still has pain in lateral ankle and anterior ankle.   Mild swelling.  No numbness or tingling.   No significant bruising.  Not currently using a brace.   Has used elevation and ice.   No other aggravating or relieving factors.    No other c/o.  Past Medical History:  Diagnosis Date   Allergy    Anxiety    Congenital single kidney    per prior ultrasound and other imaging   Depression    Depression 05/03/2016   Depression, major, single episode, in partial remission (HCC) 05/14/2019   GERD (gastroesophageal reflux disease)    High triglycerides    History of EKG 2016   normal per report   Hyperlipidemia    Hypertension 2004   Migraine    Renal agenesis    Wart 2014   finger   Wears contact lenses    Current Outpatient Medications on File Prior to Visit  Medication Sig Dispense Refill   albuterol  (VENTOLIN  HFA) 108 (90 Base) MCG/ACT inhaler Inhale 2 puffs into the lungs every 6 (six) hours as needed for wheezing or shortness of breath. 8.5 each 1   budesonide -formoterol  (SYMBICORT ) 160-4.5 MCG/ACT inhaler Inhale 2 puffs into the lungs 2 (two) times daily. 10.2 g 2   buPROPion  (WELLBUTRIN  SR) 150 MG 12 hr tablet TAKE 1 TABLET TWICE A DAY 180 tablet 1   eletriptan  (RELPAX ) 20 MG tablet Take 1 tablet (20 mg total) by mouth as needed for migraine or headache. May repeat in 2 hours if  headache persists or recurs. 10 tablet 1   esomeprazole (NEXIUM) 20 MG capsule Take 20 mg by mouth daily at 12 noon.     hydrocortisone  (ANUSOL -HC) 25 MG suppository PLACE 1 SUPPOSITORY RECTALLY 2 TIMES DAILY. 30 suppository 1   hydrocortisone  2.5 % cream Apply topically 2 (two) times daily. 30 g 0   icosapent  Ethyl (VASCEPA ) 1 g capsule TAKE 2 CAPSULES TWICE A DAY 360 capsule 1   lisinopril  (ZESTRIL ) 5 MG tablet TAKE 1 TABLET DAILY 90 tablet 3   Multiple Vitamin (MULTIVITAMIN) tablet Take 1 tablet by mouth daily.     rosuvastatin  (CRESTOR ) 20 MG tablet TAKE 1 TABLET DAILY 90 tablet 1   terbinafine  (LAMISIL ) 250 MG tablet Take 1 tablet (250 mg total) by mouth daily. 30 tablet 1   No current facility-administered medications on file prior to visit.    The following portions of the patient's history were reviewed and updated as appropriate: allergies, current medications, past family history, past medical history, past social history, past surgical history and problem list.  ROS Otherwise as in subjective above   Objective: BP 120/80   Pulse 94   Wt 189 lb 6.4 oz (85.9 kg)   SpO2 98%  BMI 25.69 kg/m   General appearance: alert, no distress, well developed, well nourished MSK: Tender over the right lateral ankle and anterior ankle and mild tenderness of the Achilles, no significant swelling, no laxity, no bruising.  Tender over the lateral malleolus.  Otherwise foot nontender with no other deformity Pulses normal Normal strength and sensation of right foot    Assessment: Encounter Diagnosis  Name Primary?   Sprain of anterior talofibular ligament of right ankle, subsequent encounter Yes     Plan: We discussed his sprain injury.  I gave him the option of repeat x-ray.  I do not have access to the initial x-ray that was done the same day that was reportedly normal.  He is weightbearing without significant pain  Recommendations: Continue to use cold therapy such as bucket of  cold water or ice water 20 minutes 2-3 times a day for the next week You can also alternate cold therapy and heat therapy 20 minutes each 2-3 times a day You can use Tylenol  for pain, 325 mg every 6 hours as needed I do recommend you start using an ASO ankle stabilizer brace as we discussed.  Use this for the next 1 to 2 weeks I recommend range of motion stretching daily I recommend you start doing some home therapy exercises with a towel for the next 3 to 5 days.  Once you are tolerating this fairly well over the next 3-5 days, then graduate to resistance band therapy doing therapy exercises in 4 directions as discussed. If you do not see significant improvement over the next 10 days and let me know.  We can refer to physical therapy and possibly get a repeat x-ray if needed.   Chase Rogers was seen today for acute visit.  Diagnoses and all orders for this visit:  Sprain of anterior talofibular ligament of right ankle, subsequent encounter    Follow up: call report 10 days

## 2023-12-19 NOTE — Patient Instructions (Signed)
  Recommendations: Continue to use cold therapy such as bucket of cold water or ice water 20 minutes 2-3 times a day for the next week You can also alternate cold therapy and heat therapy 20 minutes each 2-3 times a day You can use Tylenol  for pain, 325 mg every 6 hours as needed I do recommend you start using an ASO ankle stabilizer brace as we discussed.  Use this for the next 1 to 2 weeks I recommend range of motion stretching daily I recommend you start doing some home therapy exercises with a towel for the next 3 to 5 days.  Once you are tolerating this fairly well over the next 3-5 days, then graduate to resistance band therapy doing therapy exercises in 4 directions as discussed. If you do not see significant improvement over the next 10 days and let me know.  We can refer to physical therapy and possibly get a repeat x-ray if needed.

## 2023-12-21 ENCOUNTER — Encounter: Payer: Self-pay | Admitting: Gastroenterology

## 2023-12-27 ENCOUNTER — Telehealth: Payer: Self-pay

## 2023-12-27 ENCOUNTER — Other Ambulatory Visit (HOSPITAL_COMMUNITY): Payer: Self-pay

## 2023-12-27 NOTE — Telephone Encounter (Signed)
 Pharmacy Patient Advocate Encounter   Received notification from CoverMyMeds that prior authorization for Budesonide -Formoterol  Fumarate 160-4.5MCG/ACT aerosol is required/requested.   Insurance verification completed.   The patient is insured through [employer] .   Per test claim: PA required; PA submitted to above mentioned insurance via CoverMyMeds Key/confirmation #/EOC (Key: [id number])   Status is pending

## 2023-12-28 ENCOUNTER — Other Ambulatory Visit: Payer: Self-pay | Admitting: Medical

## 2023-12-29 ENCOUNTER — Other Ambulatory Visit (HOSPITAL_COMMUNITY): Payer: Self-pay

## 2023-12-29 NOTE — Telephone Encounter (Signed)
 Pharmacy Patient Advocate Encounter  Received notification from [facility] [employer] that Prior Authorization for Budesonide -Formoterol  Fumarate 160-4.5MCG/ACT aerosol  has been APPROVED from 12/27/2023 to 12/26/2024. Unable to obtain price due to refill too soon rejection, last fill date 12/28/2023 next available fill date08/28/2025.   PA #/Case ID/Reference #: [id number]

## 2024-01-02 ENCOUNTER — Other Ambulatory Visit: Payer: Self-pay | Admitting: Medical

## 2024-01-02 ENCOUNTER — Ambulatory Visit
Admission: RE | Admit: 2024-01-02 | Discharge: 2024-01-02 | Disposition: A | Discharge status: Home / Self Care | Source: Ambulatory Visit | Attending: Medical | Admitting: Medical

## 2024-01-02 DIAGNOSIS — S99911D Unspecified injury of right ankle, subsequent encounter: Secondary | ICD-10-CM

## 2024-01-02 DIAGNOSIS — M25571 Pain in right ankle and joints of right foot: Secondary | ICD-10-CM

## 2024-01-10 ENCOUNTER — Ambulatory Visit: Payer: Self-pay | Admitting: Medical

## 2024-01-10 NOTE — Progress Notes (Signed)
 Thankfully no obvious fracture.  There is still some soft tissue swelling.  If still not having a whole lot of improvement in pain and swelling, I would recommend possibly using a cam walker boot for 2 weeks and possible some physical therapy.  If not improving so much, then lets do a follow up with me or Dr. Vita here who does sports medicine

## 2024-01-11 ENCOUNTER — Other Ambulatory Visit: Payer: Self-pay | Admitting: Medical

## 2024-01-14 ENCOUNTER — Other Ambulatory Visit: Payer: Self-pay | Admitting: Medical

## 2024-02-09 ENCOUNTER — Ambulatory Visit: Admitting: Gastroenterology

## 2024-02-09 ENCOUNTER — Encounter: Payer: Self-pay | Admitting: Gastroenterology

## 2024-02-09 VITALS — BP 130/80 | HR 95 | Ht 72.0 in | Wt 187.4 lb

## 2024-02-09 DIAGNOSIS — K629 Disease of anus and rectum, unspecified: Secondary | ICD-10-CM

## 2024-02-09 DIAGNOSIS — K529 Noninfective gastroenteritis and colitis, unspecified: Secondary | ICD-10-CM | POA: Diagnosis not present

## 2024-02-09 DIAGNOSIS — K6289 Other specified diseases of anus and rectum: Secondary | ICD-10-CM | POA: Insufficient documentation

## 2024-02-09 DIAGNOSIS — K649 Unspecified hemorrhoids: Secondary | ICD-10-CM | POA: Diagnosis not present

## 2024-02-09 DIAGNOSIS — K648 Other hemorrhoids: Secondary | ICD-10-CM | POA: Insufficient documentation

## 2024-02-09 NOTE — Progress Notes (Signed)
 ____________________________________________________________  Attending physician addendum:  Thank you for sending this case to me.  This is a classic description of proctalgia fugax, and this pain does NOT sound hemorrhoidal in nature, and therefore I would NOT recommend hemorrhoidal banding.  There is a high chance he would have a painful response to banding.  He should be reassured of the diagnosis informed that there are no specific therapies for this condition that have data to support their use.  Since he had previous colonoscopic evaluation and tendency to loose stool that could be an IBS-like picture, it would be reasonable to give him a trial of an antispasmodic for that if he would like to do so.  Victory Brand, MD  ____________________________________________________________

## 2024-02-09 NOTE — Patient Instructions (Signed)
 Your provider has requested that you go to the basement level for lab work before leaving today. Press B on the elevator. The lab is located at the first door on the left as you exit the elevator.  Hemorrhoid banding scheduled for Wednesday 02/22/24 @ 2:40 pm.   _______________________________________________________  If your blood pressure at your visit was 140/90 or greater, please contact your primary care physician to follow up on this.  _______________________________________________________  If you are age 53 or older, your body mass index should be between 23-30. Your Body mass index is 25.41 kg/m. If this is out of the aforementioned range listed, please consider follow up with your Primary Care Provider.  If you are age 55 or younger, your body mass index should be between 19-25. Your Body mass index is 25.41 kg/m. If this is out of the aformentioned range listed, please consider follow up with your Primary Care Provider.   ________________________________________________________  The [facility] providers would like to encourage you to use MYCHART to communicate with providers for non-urgent requests or questions.  Due to long hold times on the telephone, sending your provider a message by [facility] may be a faster and more efficient way to get a response.  Please allow 48 business hours for a response.  Please remember that this is for non-urgent requests.  _______________________________________________________  [facility] is using a team-based approach to care.  Your team is made up of your doctor and two to three APPS. Our APPS (Nurse Practitioners and Physician Assistants) work with your physician to ensure care continuity for you. They are fully qualified to address your health concerns and develop a treatment plan. They communicate directly with your gastroenterologist to care for you. Seeing the Advanced Practice Practitioners on your physician's team can help you  by facilitating care more promptly, often allowing for earlier appointments, access to diagnostic testing, procedures, and other specialty referrals.

## 2024-02-09 NOTE — Progress Notes (Signed)
 02/09/2024 Chase Rogers [id number] 1970/10/02   HISTORY OF PRESENT ILLNESS: This is a 54 year old male who is previously a patient of Dr. Eda, known to her for 1 office visit and then colonoscopy as below.  He is actually here today with complaints of rectal pain.  He says that he knows that he has internal hemorrhoids.  He says that this pain occurs intermittently, maybe once a month or every 6 weeks or so.  He says that the sudden onset when described as a lightening type pain.  Wakes him up from sleep at night.  Says that it lasts maybe a minute or so, but feels like a long time.  Has used Anusol  suppositories and hydrocortisone  cream as well, which have not helped in regards to this pain.  He has occasional rectal bleeding just bright red on the toilet paper.  He does tell me that he he has very frequent stools, probably 6-10 bowel movements a day.  He says that he recently changed his diet some and he is now having about 2 bowel movements a day.  Even when he has 6-10 stools a day that are not necessarily diarrhea.  No abdominal pain.  Colonoscopy 11/2020: - Non- bleeding external and internal hemorrhoids. - Diverticulosis in the sigmoid colon and in the distal descending colon. - Two 1 to 2 mm polyps in the rectum and in the ascending colon, removed with a cold snare. Resected and retrieved. - The entire examined colon is normal. Biopsied. - Congested, erythematous and ulcerated mucosa in the distal ileum. Findings may be related to NSAIDs. Biopsied. - The examination was otherwise normal on direct and retroflexion views.  1. Surgical [P], colon, ascending and rectal, polyp (2) - BENIGN COLONIC MUCOSA (3 OF 3 FRAGMENTS) - NO HIGH-GRADE DYSPLASIA OR MALIGNANCY IDENTIFIED - SEE COMMENT 2. Surgical [P], small bowel, terminal ileum - CHRONIC ACTIVE ILEITIS - NO GRANULOMATA, DYSPLASIA OR MALIGNANCY IDENTIFIED 3. Surgical [P], right colon - BENIGN COLONIC MUCOSA - NO ACTIVE  INFLAMMATION OR EVIDENCE OF MICROSCOPIC COLITIS - NO HIGH GRADE DYSPLASIA OR MALIGNANCY IDENTIFIED 4. Surgical [P], colon, transverse biopsies - BENIGN COLONIC MUCOSA - NO ACTIVE INFLAMMATION OR EVIDENCE OF MICROSCOPIC COLITIS - NO HIGH GRADE DYSPLASIA OR MALIGNANCY IDENTIFIED 5. Surgical [P], left colon biopsies - BENIGN COLONIC MUCOSA - NO ACTIVE INFLAMMATION OR EVIDENCE OF MICROSCOPIC COLITIS - NO HIGH GRADE DYSPLASIA OR MALIGNANCY IDENTIFIED 6. Surgical [P], colon, rectum biopsies - BENIGN COLONIC MUCOSA - NO ACTIVE INFLAMMATION OR EVIDENCE OF MICROSCOPIC COLITIS - NO HIGH GRADE DYSPLASIA OR MALIGNANCY IDENTIFIED  With the finding of ileitis above Dr. Eda had recommended consideration of a CT enterography if he continued with some GI symptoms.  There is no follow-up after that.  Patient's brother does have Crohn's disease.   Past Medical History:  Diagnosis Date   Allergy    Anxiety    Congenital single kidney    per prior ultrasound and other imaging   Depression    Depression 05/03/2016   Depression, major, single episode, in partial remission (HCC) 05/14/2019   GERD (gastroesophageal reflux disease)    High triglycerides    History of EKG 2016   normal per report   Hyperlipidemia    Hypertension 2004   Migraine    Renal agenesis    Wart 2014   finger   Wears contact lenses    Past Surgical History:  Procedure Laterality Date   COLONOSCOPY  2012   abdominal pain, brother hx/o  Crohns.  normal per patient   COLONOSCOPY W/ BIOPSIES  11/2020   KNEE SURGERY  2007   right meniscal tear   MINOR FULGERATION OF ANAL CONDYLOMA  2013   SHOULDER ARTHROSCOPY Left 11/2020   WISDOM TOOTH EXTRACTION      reports that he quit smoking about 5 years ago. His smoking use included cigarettes. He started smoking about 15 years ago. He has a 5 pack-year smoking history. He has never been exposed to tobacco smoke. He has never used smokeless tobacco. He reports current alcohol  use. He reports that he does not use drugs. family history includes Arthritis in his father and mother; Breast cancer in his maternal grandmother; Colon cancer in his paternal grandmother; Colon polyps in his paternal grandmother; Crohn's disease in his brother; Heart attack in his maternal grandfather, maternal uncle, and paternal uncle; Heart disease in his maternal grandfather and paternal uncle; Hyperlipidemia in his mother; Hypertension in his father and mother; Melanoma in his mother; Stomach cancer in his maternal grandmother. Allergies  Allergen Reactions   Erythromycin     Age 28, unsure of allergy      Outpatient Encounter Medications as of 02/09/2024  Medication Sig   albuterol  (VENTOLIN  HFA) 108 (90 Base) MCG/ACT inhaler Inhale 2 puffs into the lungs every 6 (six) hours as needed for wheezing or shortness of breath.   budesonide -formoterol  (SYMBICORT ) 160-4.5 MCG/ACT inhaler Inhale 2 puffs into the lungs 2 (two) times daily.   buPROPion  (WELLBUTRIN  SR) 150 MG 12 hr tablet TAKE 1 TABLET TWICE A DAY   eletriptan  (RELPAX ) 20 MG tablet Take 1 tablet (20 mg total) by mouth as needed for migraine or headache. May repeat in 2 hours if headache persists or recurs.   esomeprazole (NEXIUM) 20 MG capsule Take 20 mg by mouth daily at 12 noon.   hydrocortisone  (ANUSOL -HC) 25 MG suppository PLACE 1 SUPPOSITORY RECTALLY 2 TIMES DAILY.   hydrocortisone  2.5 % cream Apply topically 2 (two) times daily.   icosapent  Ethyl (VASCEPA ) 1 g capsule TAKE 2 CAPSULES TWICE A DAY   lisinopril  (ZESTRIL ) 5 MG tablet TAKE 1 TABLET DAILY   Multiple Vitamin (MULTIVITAMIN) tablet Take 1 tablet by mouth daily.   rosuvastatin  (CRESTOR ) 20 MG tablet TAKE 1 TABLET DAILY   terbinafine  (LAMISIL ) 250 MG tablet TAKE 1 TABLET BY MOUTH EVERY DAY   No facility-administered encounter medications on file as of 02/09/2024.    REVIEW OF SYSTEMS  : All other systems reviewed and negative except where noted in the History of  Present Illness.   PHYSICAL EXAM: BP 130/80 (BP Location: Right Arm, Patient Position: Sitting, Cuff Size: Normal)   Pulse 95   Ht 6' (1.829 m)   Wt 187 lb 6 oz (85 kg)   BMI 25.41 kg/m  General: Well developed white male in no acute distress Head: Normocephalic and atraumatic Eyes:  Sclerae anicteric, conjunctiva pink. Ears: Normal auditory acuity Lungs: Clear throughout to auscultation; no W/R/R. Heart: Regular rate and rhythm; no M/R/G. Abdomen: Soft, non-distended.  BS present.  Non-tender. Rectal: No external abnormalities noted.  It appears that one of his internal hemorrhoids does prolapse to a degree.  No anal fissure noted.  DRE did not reveal any masses.  Anoscopy performed and showed moderate/medium size internal hemorrhoids. Musculoskeletal: Symmetrical with no gross deformities  Skin: No lesions on visible extremities Extremities: No edema  Neurological: Alert oriented x 4, grossly non-focal Psychological:  Alert and cooperative. Normal mood and affect  ASSESSMENT AND PLAN: *  Rectal pain: The pain that he is describing is a sudden onset lightening type pain that lasts for about a minute before resolving, occurring once a month or every 6 weeks or so.  This sounds like proctalgia fugax.  He does have internal hemorrhoids seen on his previous colonoscopy and on anoscopy today.  I would say that there medium/moderate in size.  We discussed hemorrhoid banding to see if this would help the recurrence of this.  Advised that it may, but also may not.  He is willing to proceed.  Will schedule with Dr. Legrand.  No improvement with hydrocortisone  cream or suppositories. *Ileitis: Previously had chronic active ileitis seen on his colonoscopy with biopsies in 2022.  Dr. Eda had recommended consideration of a CT enterography especially if he continued to have GI symptoms.  No follow-up was performed after that.  He does admit to frequent stools, but that is improved since changing his  diet.  Stools not necessarily diarrhea.  No rectal bleeding, no significant abdominal pain.  Will start with a fecal calprotectin to follow this up.  His brother does have Crohn's disease.   CC:  Tysinger, Alm RAMAN, PA-C

## 2024-02-13 ENCOUNTER — Other Ambulatory Visit (INDEPENDENT_AMBULATORY_CARE_PROVIDER_SITE_OTHER)

## 2024-02-13 DIAGNOSIS — K529 Noninfective gastroenteritis and colitis, unspecified: Secondary | ICD-10-CM

## 2024-02-13 NOTE — Progress Notes (Signed)
 I can discuss it with him if he likes, but I still do not recommend the banding for the reasons I indicated.  HD

## 2024-02-14 ENCOUNTER — Telehealth: Payer: Self-pay | Admitting: *Deleted

## 2024-02-14 NOTE — Telephone Encounter (Signed)
 Informed patient of information below. He would like to keep the appointment with Dr. Legrand to discuss this further and still proceed with the banding.

## 2024-02-14 NOTE — Telephone Encounter (Signed)
-----   Message from Johnson Siding D. Zehr sent at 02/13/2024  4:05 PM EDT ----- Please let the patient know that Dr. Legrand reviewed my note and agrees that his rectal pain sounds like proctalgia fugax.  Agrees that there is really nothing to do for this does not think that banding his hemorrhoids will help with this pain.  If he is still interested in the banding anyway then he can keep his appointment and Dr. Legrand can discuss it with him further that day, but not guaranteed that he will band depending on his conversation with the patient.  Thank you,  Jess ----- Message ----- From: Legrand Victory LITTIE DOUGLAS, MD Sent: 02/13/2024   4:01 PM EDT To: Harlene JONETTA Mail, PA-C

## 2024-02-15 ENCOUNTER — Ambulatory Visit: Payer: Self-pay | Admitting: Gastroenterology

## 2024-02-15 LAB — CALPROTECTIN, FECAL: Calprotectin, Fecal: 98 ug/g (ref 0–120)

## 2024-02-21 ENCOUNTER — Ambulatory Visit: Admitting: Family Medicine

## 2024-02-21 ENCOUNTER — Encounter: Payer: Self-pay | Admitting: Family Medicine

## 2024-02-21 VITALS — BP 122/80 | HR 83 | Wt 189.0 lb

## 2024-02-21 DIAGNOSIS — R519 Headache, unspecified: Secondary | ICD-10-CM

## 2024-02-21 DIAGNOSIS — R5383 Other fatigue: Secondary | ICD-10-CM | POA: Diagnosis not present

## 2024-02-21 DIAGNOSIS — I10 Essential (primary) hypertension: Secondary | ICD-10-CM

## 2024-02-21 MED ORDER — LISINOPRIL 5 MG PO TABS: 5.0000 mg | ORAL_TABLET | Freq: Every day | ORAL | 3 refills | Status: DC

## 2024-02-21 NOTE — Progress Notes (Signed)
 Name: Chase Rogers   Date of Visit: 02/21/24   Date of last visit with me: Visit date not found   CHIEF COMPLAINT:  Chief Complaint  Patient presents with   Acute Visit    Hypertension, headache. Bp started going up on Saturday been going up every sense, fatigue anxiety. Bp readings yesterday am 161/99, last night 155/92, today 149/92.        HPI:  Discussed the use of AI scribe software for clinical note transcription with the patient, who gave verbal consent to proceed.  History of Present Illness   Chase Rogers is a 53 year old male with hypertension who presents with elevated blood pressure and associated symptoms.  He has noticed elevated blood pressure readings at home, although today's in-office readings are normal. He experiences headaches, facial redness, and vertigo. The headaches differ from his typical migraines. These symptoms began on Saturday during a road trip when he noticed facial redness and a headache after not drinking fluids for several hours. He took medication for the headache and felt tired throughout the day. On Sunday, he initially felt fine but experienced fatigue and vertigo by the afternoon. He began monitoring his blood pressure, noting it was higher than usual.  He has been experiencing fatigue for several weeks, which he attributes in part to poor sleep due to discomfort from a rotator cuff injury. He is preparing for rotator cuff surgery and has been sleeping poorly due to shoulder pain. He also notes increased stress at work over the past week.  He is currently on a low dose of blood pressure medication. He has been using Tylenol for headaches, as he has only one kidney and avoids ibuprofen. Tylenol does not effectively relieve his headaches.         OBJECTIVE:       12 /31/2024    8:06 AM  Depression screen PHQ 2/9  Decreased Interest 0  Down, Depressed, Hopeless 0  PHQ - 2 Score 0  Altered sleeping 0  Tired, decreased energy 0   Change in appetite 0  Feeling bad or failure about yourself  0  Trouble concentrating 0  Moving slowly or fidgety/restless 0  Suicidal thoughts 0  PHQ-9 Score 0  Difficult doing work/chores Not difficult at all     BP Readings from Last 3 Encounters:  02/21/24 122/80  02/09/24 130/80  12/19/23 120/80    BP 122/80   Pulse 83   Wt 189 lb (85.7 kg)   SpO2 97%   BMI 25.63 kg/m    Physical Exam          Physical Exam Constitutional:      Appearance: Normal appearance.  Neurological:     General: No focal deficit present.     Mental Status: He is alert and oriented to person, place, and time. Mental status is at baseline.     ASSESSMENT/PLAN:   Assessment & Plan Other fatigue  Generalized headaches  Primary hypertension    Assessment and Plan    Hypertension with associated headache, vertigo, and fatigue Hypertension with recent headache, vertigo, and fatigue. Elevated home blood pressure, normal in office. Symptoms possibly due to dehydration and stress. Headaches atypical for migraines. Fatigue may be multifactorial. Differential includes electrolyte imbalance. - Increase blood pressure medication from 5 mg to 10 mg. - Measure blood pressure morning and evening for two weeks. - Order blood work for electrolyte imbalances and other causes of fatigue. - Reassess in three weeks with blood pressure log  and home blood pressure machine.  Insomnia Insomnia likely due to shoulder pain and stress. Poor sleep may contribute to fatigue.  Single kidney status Single kidney status noted. Avoid NSAIDs due to renal impact. Tylenol  used for headache management.  Rotator cuff tear Awaiting rotator cuff surgery. Extent of tear uncertain until surgical exploration.         Ziona Wickens A. Vita MD [facility]

## 2024-02-22 ENCOUNTER — Ambulatory Visit (INDEPENDENT_AMBULATORY_CARE_PROVIDER_SITE_OTHER): Admitting: Gastroenterology

## 2024-02-22 ENCOUNTER — Encounter: Payer: Self-pay | Admitting: Gastroenterology

## 2024-02-22 ENCOUNTER — Other Ambulatory Visit

## 2024-02-22 VITALS — BP 98/64 | HR 88 | Ht 72.0 in | Wt 187.0 lb

## 2024-02-22 DIAGNOSIS — K625 Hemorrhage of anus and rectum: Secondary | ICD-10-CM

## 2024-02-22 DIAGNOSIS — K648 Other hemorrhoids: Secondary | ICD-10-CM | POA: Diagnosis not present

## 2024-02-22 DIAGNOSIS — R194 Change in bowel habit: Secondary | ICD-10-CM | POA: Diagnosis not present

## 2024-02-22 DIAGNOSIS — K6289 Other specified diseases of anus and rectum: Secondary | ICD-10-CM

## 2024-02-22 LAB — IRON,TIBC AND FERRITIN PANEL
Ferritin: 50 ng/mL (ref 30–400)
Iron Saturation: 19 % (ref 15–55)
Iron: 72 ug/dL (ref 38–169)
Total Iron Binding Capacity: 388 ug/dL (ref 250–450)
UIBC: 316 ug/dL (ref 111–343)

## 2024-02-22 LAB — THYROID CASCADE PROFILE: TSH: 0.904 u[IU]/mL (ref 0.450–4.500)

## 2024-02-22 LAB — CBC WITH DIFFERENTIAL/PLATELET
Basophils Absolute: 0 x10E3/uL (ref 0.0–0.2)
Basos: 1 %
EOS (ABSOLUTE): 0 x10E3/uL (ref 0.0–0.4)
Eos: 1 %
Hematocrit: 49.9 % (ref 37.5–51.0)
Hemoglobin: 16.5 g/dL (ref 13.0–17.7)
Immature Grans (Abs): 0 x10E3/uL (ref 0.0–0.1)
Immature Granulocytes: 0 %
Lymphocytes Absolute: 1.2 x10E3/uL (ref 0.7–3.1)
Lymphs: 24 %
MCH: 30 pg (ref 26.6–33.0)
MCHC: 33.1 g/dL (ref 31.5–35.7)
MCV: 91 fL (ref 79–97)
Monocytes Absolute: 0.4 x10E3/uL (ref 0.1–0.9)
Monocytes: 7 %
Neutrophils Absolute: 3.5 x10E3/uL (ref 1.4–7.0)
Neutrophils: 67 %
Platelets: 366 x10E3/uL (ref 150–450)
RBC: 5.5 x10E6/uL (ref 4.14–5.80)
RDW: 12.7 % (ref 11.6–15.4)
WBC: 5.2 x10E3/uL (ref 3.4–10.8)

## 2024-02-22 LAB — BASIC METABOLIC PANEL WITH GFR
BUN/Creatinine Ratio: 20 (ref 9–20)
BUN: 22 mg/dL (ref 6–24)
CO2: 19 mmol/L — AB (ref 20–29)
Calcium: 10.2 mg/dL (ref 8.7–10.2)
Chloride: 100 mmol/L (ref 96–106)
Creatinine, Ser: 1.09 mg/dL (ref 0.76–1.27)
Glucose: 101 mg/dL — AB (ref 70–99)
Potassium: 4.4 mmol/L (ref 3.5–5.2)
Sodium: 138 mmol/L (ref 134–144)
eGFR: 82 mL/min/1.73 (ref >59)

## 2024-02-22 LAB — VITAMIN D 25 HYDROXY (VIT D DEFICIENCY, FRACTURES): Vit D, 25-Hydroxy: 75.6 ng/mL (ref 30.0–100.0)

## 2024-02-22 MED ORDER — HYOSCYAMINE SULFATE 0.125 MG SL SUBL: 0.1250 mg | SUBLINGUAL_TABLET | Freq: Every day | SUBLINGUAL | 1 refills | Status: DC

## 2024-02-22 NOTE — Progress Notes (Signed)
 [facility] Progress Note  Chief Complaint: Rectal pain, symptomatic hemorrhoids  Summary of GI history:  Previously saw Dr. Eda 2022 for abdominal pain intermittent rectal bleeding.  Colonoscopy showed internal hemorrhoids, mild ileitis biopsied, felt likely NSAID related.  Colon biopsies negative for microscopic colitis.  CT recommended (not done) Saw APP 02/09/2024 for symptoms consistent with proctalgia fugax.  Was offered hemorrhoidal banding.  Had fecal calprotectin of 98  Subjective  HPI:  Chase Rogers saw me today after the recent APP visit.  I reviewed Dr. Germaine records as noted above and have further conversation with our APP after their visit.  Shortly after his last visit here he had an episode of proctalgia that lasted a good 20 minutes, much longer than he has previously experienced. He has generalized bloating and bowel habits tend to be frequent, though improved after some diet changes and starting a fiber supplement.  He now has 2 BMs a day on average with generally good formed, still with some urgency.  No prior celiac testing  He also experiences itching prolapse and occasional blood on the paper from internal hemorrhoids  ROS: Cardiovascular:  no chest pain Respiratory: no dyspnea  The patient's Past Medical, Family and Social History were reviewed and are on file in the EMR. Past Medical History:  Diagnosis Date   Allergy    Anxiety    Congenital single kidney    per prior ultrasound and other imaging   Depression    Depression 05/03/2016   Depression, major, single episode, in partial remission 05/14/2019   GERD (gastroesophageal reflux disease)    High triglycerides    History of EKG 2016   normal per report   Hyperlipidemia    Hypertension 2004   Migraine    Renal agenesis    Wart 2014   finger   Wears contact lenses     Past Surgical History:  Procedure Laterality Date   COLONOSCOPY  2012   abdominal pain, brother hx/o Crohns.   normal per patient   COLONOSCOPY W/ BIOPSIES  11/2020   KNEE SURGERY  2007   right meniscal tear   MINOR FULGERATION OF ANAL CONDYLOMA  2013   SHOULDER ARTHROSCOPY Left 11/2020   WISDOM TOOTH EXTRACTION      Objective:  Med list reviewed  Current Outpatient Medications:    albuterol  (VENTOLIN  HFA) 108 (90 Base) MCG/ACT inhaler, Inhale 2 puffs into the lungs every 6 (six) hours as needed for wheezing or shortness of breath., Disp: 8.5 each, Rfl: 1   budesonide -formoterol  (SYMBICORT ) 160-4.5 MCG/ACT inhaler, Inhale 2 puffs into the lungs 2 (two) times daily. (Patient taking differently: Inhale 2 puffs into the lungs 2 (two) times daily. prn), Disp: 10.2 g, Rfl: 2   buPROPion  (WELLBUTRIN  SR) 150 MG 12 hr tablet, TAKE 1 TABLET TWICE A DAY, Disp: 180 tablet, Rfl: 1   eletriptan  (RELPAX ) 20 MG tablet, Take 1 tablet (20 mg total) by mouth as needed for migraine or headache. May repeat in 2 hours if headache persists or recurs., Disp: 10 tablet, Rfl: 1   esomeprazole (NEXIUM) 20 MG capsule, Take 20 mg by mouth daily at 12 noon., Disp: , Rfl:    hydrocortisone  (ANUSOL -HC) 25 MG suppository, PLACE 1 SUPPOSITORY RECTALLY 2 TIMES DAILY., Disp: 30 suppository, Rfl: 1   hydrocortisone  2.5 % cream, Apply topically 2 (two) times daily., Disp: 30 g, Rfl: 0   hyoscyamine (LEVSIN SL) 0.125 MG SL tablet, Place 1 tablet (0.125 mg total) under the  tongue daily., Disp: 30 tablet, Rfl: 1   icosapent  Ethyl (VASCEPA ) 1 g capsule, TAKE 2 CAPSULES TWICE A DAY, Disp: 360 capsule, Rfl: 3   lisinopril  (ZESTRIL ) 5 MG tablet, Take 1 tablet (5 mg total) by mouth daily., Disp: 90 tablet, Rfl: 3   Multiple Vitamin (MULTIVITAMIN) tablet, Take 1 tablet by mouth daily., Disp: , Rfl:    rosuvastatin  (CRESTOR ) 20 MG tablet, TAKE 1 TABLET DAILY, Disp: 90 tablet, Rfl: 3   terbinafine  (LAMISIL ) 250 MG tablet, TAKE 1 TABLET BY MOUTH EVERY DAY, Disp: 30 tablet, Rfl: 1   Vital signs in last 24 hrs: Vitals:   02/22/24 1452  BP:  98/64  Pulse: 88  SpO2: 99%   Wt Readings from Last 3 Encounters:  02/22/24 187 lb (84.8 kg)  02/21/24 189 lb (85.7 kg)  02/09/24 187 lb 6 oz (85 kg)    Physical Exam  Well-appearing Cardiac: Regular without appreciable murmur,  no peripheral edema Pulm: clear to auscultation bilaterally, normal RR and effort noted Abdomen: soft, no tenderness, with active bowel sounds. No guarding or palpable hepatosplenomegaly.  No distention Perianal exam reveals partially prolapsed internal hemorrhoid DRE normal Anoscopy saw swollen internal hemorrhoidal plexus, most prominently RP column  Labs:  Fecal calprotectin 98 ___________________________________________ Radiologic studies:   ____________________________________________ Other:   _____________________________________________ Assessment & Plan  Assessment: Encounter Diagnoses  Name Primary?   Rectal bleeding Yes   Rectal pain    Altered bowel habits    His altered bowel habits sound most likely to be IBS-D, though he should also be checked for celiac.  Wonder if this could be contributing to his hemorrhoidal symptoms with frequent BMs that he often experiences.  Sounds like those things are better with some recent diet changes and institution of fiber supplement.  He has symptomatic hemorrhoids, and I explained to him that the proctalgia episodes he experiences are not from the hemorrhoids.  We do not know the cause of proctalgia fugax, though suspect it may be rectal spasm.  While his hemorrhoids are symptomatic and amenable to banding, he may be at some increased chance of having post banding pain because of his episodic proctalgia. As such, I would like to check celiac labs and try him on some Levsin to see if we can regulate his BMs a little better before doing banding.  While he could try the Levsin during episodes of proctalgia, most of those episodes have been so brief that oral medicines would not help.  Plan: He will  give this plan a try, was happy to take a more conservative approach if possible, and will let us  know soon how things work out.  If the medicine you does not agree with him or if he continues to have symptomatic hemorrhoids despite that, then we will get him back for banding.  22 minutes were spent on this encounter (including chart review, history/exam, counseling/coordination of care, and documentation) > 50% of that time was spent on counseling and coordination of care.   Victory LITTIE Brand III

## 2024-02-22 NOTE — Patient Instructions (Signed)
 Your provider has requested that you go to the basement level for lab work before leaving today. Press B on the elevator. The lab is located at the first door on the left as you exit the elevator.   _______________________________________________________  If your blood pressure at your visit was 140/90 or greater, please contact your primary care physician to follow up on this.  _______________________________________________________  If you are age 53 or older, your body mass index should be between 23-30. Your Body mass index is 25.36 kg/m. If this is out of the aforementioned range listed, please consider follow up with your Primary Care Provider.  If you are age 84 or younger, your body mass index should be between 19-25. Your Body mass index is 25.36 kg/m. If this is out of the aformentioned range listed, please consider follow up with your Primary Care Provider.   ________________________________________________________  The [facility] providers would like to encourage you to use MYCHART to communicate with providers for non-urgent requests or questions.  Due to long hold times on the telephone, sending your provider a message by [facility] may be a faster and more efficient way to get a response.  Please allow 48 business hours for a response.  Please remember that this is for non-urgent requests.  _______________________________________________________  [facility] is using a team-based approach to care.  Your team is made up of your doctor and two to three APPS. Our APPS (Nurse Practitioners and Physician Assistants) work with your physician to ensure care continuity for you. They are fully qualified to address your health concerns and develop a treatment plan. They communicate directly with your gastroenterologist to care for you. Seeing the Advanced Practice Practitioners on your physician's team can help you by facilitating care more promptly, often allowing for earlier  appointments, access to diagnostic testing, procedures, and other specialty referrals.    Thank you for trusting me with your gastrointestinal care!    Dr. Victory Legrand DOUGLAS [facility]

## 2024-02-23 ENCOUNTER — Ambulatory Visit: Payer: Self-pay | Admitting: Family Medicine

## 2024-02-23 LAB — TISSUE TRANSGLUTAMINASE, IGA: (tTG) Ab, IgA: <1 U/mL

## 2024-02-23 LAB — IGA: Immunoglobulin A: 258 mg/dL (ref 47–310)

## 2024-02-24 ENCOUNTER — Ambulatory Visit: Payer: Self-pay | Admitting: Gastroenterology

## 2024-02-28 ENCOUNTER — Encounter (HOSPITAL_BASED_OUTPATIENT_CLINIC_OR_DEPARTMENT_OTHER): Payer: Self-pay | Admitting: Emergency Medicine

## 2024-02-28 ENCOUNTER — Other Ambulatory Visit: Payer: Self-pay

## 2024-02-28 DIAGNOSIS — S61250A Open bite of right index finger without damage to nail, initial encounter: Secondary | ICD-10-CM | POA: Insufficient documentation

## 2024-02-28 DIAGNOSIS — W540XXA Bitten by dog, initial encounter: Secondary | ICD-10-CM | POA: Diagnosis not present

## 2024-02-28 NOTE — ED Triage Notes (Signed)
 Pt via pov from home after being bitten by his dog. He reports that dog has been vaccinated for rabies. Pt has laceration to his right pointer finger. Pt a&o x 4; nad noted.

## 2024-02-29 ENCOUNTER — Emergency Department (HOSPITAL_BASED_OUTPATIENT_CLINIC_OR_DEPARTMENT_OTHER)
Admission: EM | Admit: 2024-02-29 | Discharge: 2024-02-29 | Disposition: A | Discharge status: Home / Self Care | Attending: Emergency Medicine | Admitting: Emergency Medicine

## 2024-02-29 DIAGNOSIS — W540XXA Bitten by dog, initial encounter: Secondary | ICD-10-CM

## 2024-02-29 MED ORDER — AMOXICILLIN-POT CLAVULANATE 875-125 MG PO TABS
1.0000 | ORAL_TABLET | Freq: Once | ORAL | Status: AC
  Administered 2024-02-29: 1 via ORAL
  Filled 2024-02-29: qty 1

## 2024-02-29 MED ORDER — AMOXICILLIN-POT CLAVULANATE 875-125 MG PO TABS: 1.0000 | ORAL_TABLET | Freq: Two times a day (BID) | ORAL | 0 refills | Status: DC

## 2024-02-29 NOTE — ED Provider Notes (Addendum)
 [facility] AT [facility] Provider Note   CSN: [id number] Arrival date & time: 02/28/24  2211     Patient presents with: Animal Bite   Chase Rogers is a 53 y.o. male.    Animal Bite    53 year old male presenting to the emergency [facility] after a dog bite to the right index finger.  The the patient states that his dogs at home got spooked and started fighting each other and he got in between them and sustained a small linear laceration to his right index finger.  Dogs are up-to-date on vaccines and the patient's tetanus is up-to-date.  He denies any other injuries or complaints.  Prior to Admission medications   Medication Sig Start Date End Date Taking? Authorizing Provider  amoxicillin -clavulanate (AUGMENTIN ) 875-125 MG tablet Take 1 tablet by mouth every 12 (twelve) hours. 02/29/24  Yes Jerrol Agent, MD  albuterol  (VENTOLIN  HFA) 108 (90 Base) MCG/ACT inhaler Inhale 2 puffs into the lungs every 6 (six) hours as needed for wheezing or shortness of breath. 06/21/23   Tysinger, Alm RAMAN, PA-C  budesonide -formoterol  (SYMBICORT ) 160-4.5 MCG/ACT inhaler Inhale 2 puffs into the lungs 2 (two) times daily. Patient taking differently: Inhale 2 puffs into the lungs 2 (two) times daily. prn 06/27/23   Tysinger, Alm RAMAN, PA-C  buPROPion  (WELLBUTRIN  SR) 150 MG 12 hr tablet TAKE 1 TABLET TWICE A DAY 01/16/24   Tysinger, Alm RAMAN, PA-C  eletriptan  (RELPAX ) 20 MG tablet Take 1 tablet (20 mg total) by mouth as needed for migraine or headache. May repeat in 2 hours if headache persists or recurs. 12/08/21   Tysinger, Alm RAMAN, PA-C  esomeprazole (NEXIUM) 20 MG capsule Take 20 mg by mouth daily at 12 noon.    [provider]  hydrocortisone  (ANUSOL -HC) 25 MG suppository PLACE 1 SUPPOSITORY RECTALLY 2 TIMES DAILY. 04/06/23   Tysinger, Alm RAMAN, PA-C  hydrocortisone  2.5 % cream Apply topically 2 (two) times daily. 01/14/22   Tysinger, Alm RAMAN, PA-C  hyoscyamine  (LEVSIN SL) 0.125 MG SL tablet Place 1 tablet (0.125 mg total) under the tongue daily. 02/22/24   Legrand Victory LITTIE DOUGLAS, MD  icosapent  Ethyl (VASCEPA ) 1 g capsule TAKE 2 CAPSULES TWICE A DAY 12/28/23   Tysinger, Alm RAMAN, PA-C  lisinopril  (ZESTRIL ) 5 MG tablet Take 1 tablet (5 mg total) by mouth daily. 02/21/24   Jha, Panav, MD  Multiple Vitamin (MULTIVITAMIN) tablet Take 1 tablet by mouth daily.    [provider]  rosuvastatin  (CRESTOR ) 20 MG tablet TAKE 1 TABLET DAILY 12/28/23   Tysinger, Alm RAMAN, PA-C  terbinafine  (LAMISIL ) 250 MG tablet TAKE 1 TABLET BY MOUTH EVERY DAY 01/11/24   Tysinger, Alm RAMAN, PA-C    Allergies: Erythromycin    Review of Systems  All other systems reviewed and are negative.   Updated Vital Signs BP (!) 160/93 (BP Location: Left Arm)   Pulse 74   Temp 98.5 F (36.9 C) (Oral)   Resp 17   Ht 6' (1.829 m)   Wt 83.9 kg   SpO2 97%   BMI 25.09 kg/m   Physical Exam Vitals and nursing note reviewed.  Constitutional:      General: He is not in acute distress. HENT:     Head: Normocephalic and atraumatic.  Eyes:     Conjunctiva/sclera: Conjunctivae normal.     Pupils: Pupils are equal, round, and reactive to light.  Cardiovascular:     Rate and Rhythm: Normal rate and regular rhythm.  Pulmonary:     Effort: Pulmonary effort is normal. No respiratory distress.  Abdominal:     General: There is no distension.     Tenderness: There is no guarding.  Musculoskeletal:        General: Signs of injury present. No deformity.     Cervical back: Neck supple.     Comments: 1 cm V-shaped laceration to the dorsum of the right index finger, hemostatic and well-approximated  Skin:    Findings: No lesion or rash.  Neurological:     General: No focal deficit present.     Mental Status: He is alert. Mental status is at baseline.     (all labs ordered are listed, but only abnormal results are displayed) Labs Reviewed - No data to  display  EKG: None  Radiology: No results found.   Procedures   Medications Ordered in the ED  amoxicillin -clavulanate (AUGMENTIN ) 875-125 MG per tablet 1 tablet (1 tablet Oral Given 02/29/24 0230)                                    Medical Decision Making Risk Prescription drug management.    53 year old male presenting to the emergency [facility] after a dog bite to the right index finger.  The the patient states that his dogs at home got spooked and started fighting each other and he got in between them and sustained a small linear laceration to his right index finger.  Dogs are up-to-date on vaccines and the patient's tetanus is up-to-date.  He denies any other injuries or complaints.  On arrival, the patient was vitally stable.  Laceration to the index finger noted after dog bite as above, wound approximated and hemostatic.  Patient irrigated the wound under tap water in the emergency [facility].  The wound was subsequently rewrapped with nursing.  I advised the importance of healing by secondary intention in the setting of antibiotic and antibiotics.  Advised outpatient Augmentin  in addition to bacitracin ointment, cleaning the wound daily with warm soapy water.  Infectious return precautions provided, no significant swelling, low concern for underlying fracture, no evidence of nailbed injury, patient overall stable for discharge at this time.     Final diagnoses:  Dog bite, initial encounter    ED Discharge Orders          Ordered    amoxicillin -clavulanate (AUGMENTIN ) 875-125 MG tablet  Every 12 hours        02/29/24 0237               Jerrol Agent, MD 02/29/24 [name]    Jerrol Agent, MD 02/29/24 [phone]

## 2024-02-29 NOTE — Discharge Instructions (Addendum)
 Dog bites have a chance of getting infected and therefore we will prescribe antibiotics, oral antibiotics (Augmentin ) has been prescribed.  Recommend topical Neosporin or bacitracin ointment and cleaning the wound with warm soapy water daily.  Watch for signs of infection which include redness, swelling, purulent drainage and worsening pain.  Standard of care is to allow the wound to heal by secondary intention meaning it will heal from the inside out and scab over

## 2024-02-29 NOTE — ED Notes (Signed)
 Pt states dog up to date on vaccines.

## 2024-02-29 NOTE — ED Notes (Signed)
 Pt's wound cleaned, new bandage applied. Pt tolerated well.

## 2024-03-06 ENCOUNTER — Other Ambulatory Visit: Payer: Self-pay | Admitting: Medical

## 2024-03-07 ENCOUNTER — Inpatient Hospital Stay: Admitting: Medical

## 2024-03-15 ENCOUNTER — Other Ambulatory Visit: Payer: Self-pay | Admitting: Gastroenterology

## 2024-03-20 ENCOUNTER — Ambulatory Visit: Admitting: Family Medicine

## 2024-03-20 ENCOUNTER — Encounter: Payer: Self-pay | Admitting: Family Medicine

## 2024-03-20 ENCOUNTER — Other Ambulatory Visit: Payer: Self-pay | Admitting: Medical Genetics

## 2024-03-20 ENCOUNTER — Telehealth: Payer: Self-pay | Admitting: *Deleted

## 2024-03-20 VITALS — BP 130/80 | HR 82 | Wt 192.0 lb

## 2024-03-20 DIAGNOSIS — I10 Essential (primary) hypertension: Secondary | ICD-10-CM

## 2024-03-20 DIAGNOSIS — Z23 Encounter for immunization: Secondary | ICD-10-CM | POA: Diagnosis not present

## 2024-03-20 MED ORDER — AMLODIPINE BESYLATE 10 MG PO TABS: 10.0000 mg | ORAL_TABLET | Freq: Every day | ORAL | 0 refills | Status: DC

## 2024-03-20 MED ORDER — LISINOPRIL 10 MG PO TABS: 10.0000 mg | ORAL_TABLET | Freq: Every day | ORAL | 1 refills | Status: AC

## 2024-03-20 NOTE — Progress Notes (Signed)
   Name: Chase Rogers   Date of Visit: 03/20/24   Date of last visit with me: 02/21/2024   CHIEF COMPLAINT:  Chief Complaint  Patient presents with   Follow-up    Bp follow up.        HPI:  Discussed the use of AI scribe software for clinical note transcription with the patient, who gave verbal consent to proceed.  History of Present Illness   Chase Rogers is a 53 year old male with hypertension who presents for blood pressure management.  He has been monitoring his blood pressure at home, with consistently high readings ranging from 150/90 mmHg to the upper 140s/150s. The most recent measurement was 150/90 mmHg. Despite these elevated readings, he has not experienced headaches or the flushed feeling he associates with high blood pressure since starting his current medication regimen.  He is currently taking lisinopril , which was recently increased to two pills. Despite the increase, his blood pressure remains elevated, though he feels well symptomatically.  He underwent shoulder surgery a couple of weeks ago. His blood pressure was 157/93 mmHg preoperatively and dropped to 120/83 mmHg postoperatively. He attributes some of the elevated readings to stress and pain related to the surgery.         OBJECTIVE:       05/24/2023    8:06 AM  Depression screen PHQ 2/9  Decreased Interest 0  Down, Depressed, Hopeless 0  PHQ - 2 Score 0  Altered sleeping 0  Tired, decreased energy 0  Change in appetite 0  Feeling bad or failure about yourself  0  Trouble concentrating 0  Moving slowly or fidgety/restless 0  Suicidal thoughts 0  PHQ-9 Score 0  Difficult doing work/chores Not difficult at all     BP Readings from Last 3 Encounters:  03/20/24 130/80  02/29/24 (!) 159/87  02/22/24 98/64    BP 130/80   Pulse 82   Wt 192 lb (87.1 kg)   BMI 26.04 kg/m    Physical Exam          Physical Exam Constitutional:      Appearance: Normal appearance.   Neurological:     General: No focal deficit present.     Mental Status: He is alert and oriented to person, place, and time. Mental status is at baseline.     ASSESSMENT/PLAN:   Assessment & Plan Flu vaccine need  Need for COVID-19 vaccine  Primary hypertension    Assessment and Plan    Hypertension Hypertension remains elevated at 150/90 mmHg despite lisinopril . Headaches improved, indicating some symptomatic relief. Current lisinopril  dose provides renal protection. - Add amlodipine due to minimal side effects and efficacy in patients under 65. - Continue lisinopril  at current dose with potential for future increase. - Prescribe amlodipine and send prescription to [facility] on [address]. - Send lisinopril  prescription to [facility]. - Instruct to measure blood pressure periodically, checking morning and evening readings. - Schedule follow-up in 3-4 months to reassess blood pressure control. - Advise to make an acute appointment if blood pressure remains high without stressors.         Male Iafrate A. Vita MD [facility]

## 2024-03-20 NOTE — Patient Instructions (Signed)
 Please measure your blood pressure morning and evening as discussed.  Please write these down somewhere so that you may bring them to your next appointment when you visit with me or whichever provider you may follow-up with.

## 2024-03-20 NOTE — Telephone Encounter (Signed)
 Patient asking to switch to Dr. Vita as PCP. Has CPE in Jan if okaying switch-he has CPE in Jan is this ok to change.

## 2024-03-21 NOTE — Telephone Encounter (Signed)
 Change made and r/s'd CPE to Dr. Vita.

## 2024-04-01 ENCOUNTER — Other Ambulatory Visit: Payer: Self-pay | Admitting: Medical

## 2024-04-12 ENCOUNTER — Other Ambulatory Visit

## 2024-04-12 DIAGNOSIS — Z006 Encounter for examination for normal comparison and control in clinical research program: Secondary | ICD-10-CM

## 2024-04-23 LAB — GENECONNECT MOLECULAR SCREEN: Genetic Analysis Overall Interpretation: NEGATIVE

## 2024-05-28 ENCOUNTER — Encounter: Payer: Self-pay | Admitting: Family Medicine

## 2024-05-28 ENCOUNTER — Ambulatory Visit (INDEPENDENT_AMBULATORY_CARE_PROVIDER_SITE_OTHER): Admitting: Family Medicine

## 2024-05-28 VITALS — BP 124/74 | HR 84 | Ht 71.0 in | Wt 191.8 lb

## 2024-05-28 DIAGNOSIS — R221 Localized swelling, mass and lump, neck: Secondary | ICD-10-CM | POA: Diagnosis not present

## 2024-05-28 DIAGNOSIS — B351 Tinea unguium: Secondary | ICD-10-CM | POA: Diagnosis not present

## 2024-05-28 DIAGNOSIS — E785 Hyperlipidemia, unspecified: Secondary | ICD-10-CM

## 2024-05-28 DIAGNOSIS — Z0000 Encounter for general adult medical examination without abnormal findings: Secondary | ICD-10-CM | POA: Diagnosis not present

## 2024-05-28 LAB — THYROID CASCADE PROFILE: TSH: 0.938 u[IU]/mL (ref 0.450–4.500)

## 2024-05-28 LAB — COMPREHENSIVE METABOLIC PANEL WITH GFR
ALT: 60 IU/L — ABNORMAL HIGH (ref 0–44)
AST: 62 IU/L — ABNORMAL HIGH (ref 0–40)
Albumin: 4.7 g/dL (ref 3.8–4.9)
Alkaline Phosphatase: 77 IU/L (ref 47–123)
BUN/Creatinine Ratio: 18 (ref 9–20)
BUN: 17 mg/dL (ref 6–24)
Bilirubin Total: 0.5 mg/dL (ref 0.0–1.2)
CO2: 22 mmol/L (ref 20–29)
Calcium: 10 mg/dL (ref 8.7–10.2)
Chloride: 104 mmol/L (ref 96–106)
Creatinine, Ser: 0.97 mg/dL (ref 0.76–1.27)
Globulin, Total: 2.2 g/dL (ref 1.5–4.5)
Glucose: 106 mg/dL — ABNORMAL HIGH (ref 70–99)
Potassium: 4.6 mmol/L (ref 3.5–5.2)
Sodium: 140 mmol/L (ref 134–144)
Total Protein: 6.9 g/dL (ref 6.0–8.5)
eGFR: 93 mL/min/1.73

## 2024-05-28 LAB — CBC WITH DIFFERENTIAL/PLATELET
Basophils Absolute: 0 x10E3/uL (ref 0.0–0.2)
Basos: 1 %
EOS (ABSOLUTE): 0 x10E3/uL (ref 0.0–0.4)
Eos: 1 %
Hematocrit: 45.5 % (ref 37.5–51.0)
Hemoglobin: 15 g/dL (ref 13.0–17.7)
Immature Grans (Abs): 0 x10E3/uL (ref 0.0–0.1)
Immature Granulocytes: 0 %
Lymphocytes Absolute: 1.3 x10E3/uL (ref 0.7–3.1)
Lymphs: 27 %
MCH: 29.1 pg (ref 26.6–33.0)
MCHC: 33 g/dL (ref 31.5–35.7)
MCV: 88 fL (ref 79–97)
Monocytes Absolute: 0.5 x10E3/uL (ref 0.1–0.9)
Monocytes: 10 %
Neutrophils Absolute: 2.9 x10E3/uL (ref 1.4–7.0)
Neutrophils: 61 %
Platelets: 383 x10E3/uL (ref 150–450)
RBC: 5.15 x10E6/uL (ref 4.14–5.80)
RDW: 13 % (ref 11.6–15.4)
WBC: 4.7 x10E3/uL (ref 3.4–10.8)

## 2024-05-28 LAB — LIPID PANEL
Chol/HDL Ratio: 2.7 ratio (ref 0.0–5.0)
Cholesterol, Total: 150 mg/dL (ref 100–199)
HDL: 55 mg/dL
LDL Chol Calc (NIH): 79 mg/dL (ref 0–99)
Triglycerides: 81 mg/dL (ref 0–149)
VLDL Cholesterol Cal: 16 mg/dL (ref 5–40)

## 2024-05-28 NOTE — Progress Notes (Signed)
" ° °  Name: Chase Rogers   Date of Visit: 05/28/2024   Date of last visit with me: 03/20/2024   CHIEF COMPLAINT:  Chief Complaint  Patient presents with   Annual Exam    Cpe. No questions or concerns.        HPI:  Discussed the use of AI scribe software for clinical note transcription with the patient, who gave verbal consent to proceed.  History of Present Illness   Chase Rogers is a 54 year old male who presents with a growing mass and toenail fungus.  He has a mass that was evaluated last year when it was smaller and not considered concerning. The mass has since grown slightly and is tender when pressure is applied, according to the patient.  He has been dealing with toenail fungus for a while and was treated with oral Lamisil  for three to four months. Despite this treatment, the nail on his big toe separated and came off a couple of weeks ago. He has not seen a podiatrist for this issue.  His blood pressure has been stable without any concerning symptoms, although he has not been checking it regularly at home.         OBJECTIVE:       05/28/2024    9:10 AM  Depression screen PHQ 2/9  Decreased Interest 0  Down, Depressed, Hopeless 0  PHQ - 2 Score 0     BP Readings from Last 3 Encounters:  05/28/24 124/74  03/20/24 130/80  02/29/24 (!) 159/87    BP 124/74   Pulse 84   Ht 5' 11 (1.803 m)   Wt 191 lb 12.8 oz (87 kg)   SpO2 98%   BMI 26.75 kg/m    Physical Exam          Physical Exam Constitutional:      Appearance: Normal appearance.  Neurological:     General: No focal deficit present.     Mental Status: He is alert and oriented to person, place, and time. Mental status is at baseline.     ASSESSMENT/PLAN:   Assessment & Plan Hyperlipidemia, unspecified hyperlipidemia type  Annual physical exam  Neck nodule  Onychomycosis    Assessment and Plan    Neck nodule Nodule increased in size and tender. Differential includes reactive  lymph node. Further evaluation needed. - Ordered ultrasound of the neck nodule. - Ordered TSH to evaluate thyroid  function.  Onychomycosis Chronic onychomycosis of the big toenail. Previous partial nail removal. No podiatry consultation yet. - Referred to podiatry for evaluation and management of the toenail.  HLD -Lipid, CMP and CBC pending.   General Health Maintenance Routine health maintenance up to date. Blood pressure well-controlled. - Performed basic labs. - Scheduled follow-up in six months.  -Comprehensive annual physical exam completed today. Reviewed interval history, current medical issues, medications, allergies, and preventive care needs. Addressed all patient questions and concerns. Discussed lifestyle factors including diet, exercise, sleep, and stress management. Reviewed recommended age-appropriate screenings, labs, and vaccinations. Counseling provided on healthy habits and routine health maintenance. Follow-up as indicated based on findings and results.         Chase Rogers A. Vita MD [facility] "

## 2024-05-29 ENCOUNTER — Ambulatory Visit: Payer: Self-pay | Admitting: Family Medicine

## 2024-05-30 ENCOUNTER — Encounter: Payer: 59 | Admitting: Medical

## 2024-06-04 ENCOUNTER — Encounter: Payer: Self-pay | Admitting: Medical

## 2024-06-04 ENCOUNTER — Other Ambulatory Visit

## 2024-06-04 ENCOUNTER — Telehealth: Payer: Self-pay | Admitting: Pharmacy Technician

## 2024-06-04 ENCOUNTER — Other Ambulatory Visit (HOSPITAL_COMMUNITY): Payer: Self-pay

## 2024-06-04 NOTE — Telephone Encounter (Signed)
 Pharmacy Patient Advocate Encounter  Received notification from [facility] that Prior Authorization for Vascepa  1GM capsules has been APPROVED from 05/05/2024 to 06/04/2025.   PA #/Case ID/Reference #: [id number]

## 2024-06-04 NOTE — Telephone Encounter (Signed)
 Pharmacy Patient Advocate Encounter   Received notification from [employer] that prior authorization for Vascepa  1GM capsules is due for renewal.   Insurance verification completed.   The patient is insured through [employer].  Action: PA required; PA submitted to above mentioned insurance via Latent Key/confirmation #/[facility] Status is pending

## 2024-06-07 ENCOUNTER — Other Ambulatory Visit: Payer: Self-pay

## 2024-06-07 ENCOUNTER — Other Ambulatory Visit: Payer: Self-pay | Admitting: Gastroenterology

## 2024-06-07 DIAGNOSIS — I10 Essential (primary) hypertension: Secondary | ICD-10-CM

## 2024-06-07 MED ORDER — AMLODIPINE BESYLATE 10 MG PO TABS: 10.0000 mg | ORAL_TABLET | Freq: Every day | ORAL | 0 refills | Status: AC

## 2024-06-11 ENCOUNTER — Ambulatory Visit (INDEPENDENT_AMBULATORY_CARE_PROVIDER_SITE_OTHER): Admitting: Podiatry

## 2024-06-11 ENCOUNTER — Ambulatory Visit (HOSPITAL_COMMUNITY): Admission: RE | Admit: 2024-06-11 | Source: Ambulatory Visit

## 2024-06-11 ENCOUNTER — Encounter: Payer: Self-pay | Admitting: Podiatry

## 2024-06-11 ENCOUNTER — Ambulatory Visit (HOSPITAL_COMMUNITY)
Admission: RE | Admit: 2024-06-11 | Discharge: 2024-06-11 | Disposition: A | Discharge status: Home / Self Care | Source: Ambulatory Visit | Attending: Family Medicine | Admitting: Family Medicine

## 2024-06-11 VITALS — Ht 71.0 in | Wt 191.8 lb

## 2024-06-11 DIAGNOSIS — B351 Tinea unguium: Secondary | ICD-10-CM

## 2024-06-11 DIAGNOSIS — R221 Localized swelling, mass and lump, neck: Secondary | ICD-10-CM | POA: Diagnosis present

## 2024-06-11 MED ORDER — CICLOPIROX 8 % EX SOLN: Freq: Every day | CUTANEOUS | 2 refills | Status: AC

## 2024-06-11 NOTE — Addendum Note (Signed)
 Addended by: JANIT THRESA HERO on: 06/11/2024 09:16 AM   Modules accepted: Orders

## 2024-06-11 NOTE — Progress Notes (Signed)
" ° °  Chief Complaint  Patient presents with   Nail Problem    Pt is here due to toenail fungal infection to the toes on the left foot, states he has been dealing with this for a while states he was taking lamisil  for 4 months seen some improvement, stop the medication 1-2 months ago, 3 weeks ago he noticed that the great toenail was coming off, seen PCP and was referred here to f/u.    Subjective: 54 y.o. male presenting today as a new patient for evaluation of fungal nail infection to the left foot times several years.  Past Medical History:  Diagnosis Date   Allergy    Anxiety    Congenital single kidney    per prior ultrasound and other imaging   Depression    Depression 05/03/2016   Depression, major, single episode, in partial remission 05/14/2019   GERD (gastroesophageal reflux disease)    High triglycerides    History of EKG 2016   normal per report   Hyperlipidemia    Hypertension 2004   Migraine    Renal agenesis    Wart 2014   finger   Wears contact lenses     Past Surgical History:  Procedure Laterality Date   COLONOSCOPY  2012   abdominal pain, brother hx/o Crohns.  normal per patient   COLONOSCOPY W/ BIOPSIES  11/2020   KNEE SURGERY  2007   right meniscal tear   MINOR FULGERATION OF ANAL CONDYLOMA  2013   SHOULDER ARTHROSCOPY Left 11/2020   WISDOM TOOTH EXTRACTION      Allergies[1]   06/11/2024 LT foot  Objective: Physical Exam General: The patient is alert and oriented x3 in no acute distress.  Dermatology: Hyperkeratotic, discolored, thickened, onychodystrophy noted. Skin is warm, dry and supple bilateral lower extremities. Negative for open lesions or macerations.  Vascular: Palpable pedal pulses bilaterally. No edema or erythema noted. Capillary refill within normal limits.  Neurological: Grossly intact via light touch  Musculoskeletal Exam: No pedal deformity noted  Assessment: #1 Onychomycosis of toenails left  Plan of Care:  -Patient  was evaluated. -Today we discussed different treatment options including oral, topical, and laser antifungal treatment modalities.  We discussed their efficacies and side effects.  Patient opts for oral antifungal treatment modality -CMP taken 05/28/2024 hepatic function was elevated.  No oral Lamisil  prescribed.  He is planning for repeat blood work with his PCP.  If WNL he may resume the oral Lamisil  with close monitoring of hepatic function -Prescription for Penlac  8% topical solution apply daily -Laser antifungal treatment modality was also discussed with the patient.  He will contact the office if he would like to pursue this option -Return to clinic PRN   Thresa EMERSON Sar, DPM [facility]  Dr. Thresa EMERSON Sar, DPM    [address] [place], [place] [address]                Office [phone]  Fax [phone]        [1]  Allergies Allergen Reactions   Erythromycin     Age 36, unsure of allergy   "

## 2024-06-20 NOTE — Progress Notes (Unsigned)
 No chief complaint on file.  Patient presents for f/u neck ultrasound results.  When he saw Dr. Vita for his physical earlier this month, he had mentioned that he had a mass in his neck that has grown slightly over the last year, and was tender to touch. Ultrasound was ordered. Labs were checked--see below.  Normal CBC and thyroid  tests.  US  of soft tissues of neck was done 06/11/24: IMPRESSION: 1. Ovoid, avascular mass with heterogeneous echotexture and possible layering debris in the subcutaneous fat along the medial margin of the left sternocleidomastoid muscle, measuring up to 1.6 x 0.5 x 1.2 cm. Differential considerations include epidermal inclusion cyst, lipoma, or possibly a complicated cyst. Further characterization with contrast-enhanced neck CT is recommended.   Labs reviewed--  Fasting glu 106 Elevated LFT's--he was advised this may be from fatty liver and is due to have this rechecked Lab Results  Component Value Date   ALT 60 (H) 05/28/2024   AST 62 (H) 05/28/2024   ALKPHOS 77 05/28/2024   BILITOT 0.5 05/28/2024   Normal CBC Lab Results  Component Value Date   TSH 0.938 05/28/2024   Lipids were well controlled on his regimen (which includes statin and vascepa )  Lab Results  Component Value Date   CHOL 150 05/28/2024   HDL 55 05/28/2024   LDLCALC 79 05/28/2024   TRIG 81 05/28/2024   CHOLHDL 2.7 05/28/2024      PMH, PSH, SH reviewed  HTN, HLD     ROS:    PHYSICAL EXAM:  There were no vitals taken for this visit.  Wt Readings from Last 3 Encounters:  06/11/24 191 lb 12.8 oz (87 kg)  05/28/24 191 lb 12.8 oz (87 kg)  03/20/24 192 lb (87.1 kg)       ASSESSMENT/PLAN:  Dr. Fredy result note states that he entered the order for repeat LFTs to be done in a month. There is no future order in system, nor any lab visit scheduled.  Needs this ordered and scheduled (probably could wait longer than 1 month...)

## 2024-06-21 ENCOUNTER — Ambulatory Visit: Admitting: Family Medicine

## 2024-06-21 ENCOUNTER — Encounter: Payer: Self-pay | Admitting: Family Medicine

## 2024-06-21 VITALS — BP 112/68 | HR 72 | Ht 71.0 in | Wt 190.8 lb

## 2024-06-21 DIAGNOSIS — R221 Localized swelling, mass and lump, neck: Secondary | ICD-10-CM

## 2024-06-21 DIAGNOSIS — E785 Hyperlipidemia, unspecified: Secondary | ICD-10-CM

## 2024-06-21 DIAGNOSIS — R7989 Other specified abnormal findings of blood chemistry: Secondary | ICD-10-CM | POA: Diagnosis not present

## 2024-06-21 NOTE — Patient Instructions (Signed)
 We discussed continuing to monitor the lump on the left neck, since the change has been gradual.  There doesn't appear to be any evidence of infection. If it increases in size rapidly, becomes red, painful, this could indicate an infected cyst, and you should be treated for this.  If you notice increase in size, any symptoms, options include seeing a surgeon for possible excision, vs doing the CT scan for further characterization (likely needed only if rapidly growing, or planning surgery).  Overall, the lump doesn't not appear worrisome. It is not part of your thyroid  gland.  We are rechecking your liver tests today.  I suspect they were up related to the recent antifungal treatment.  If they aren't completely back to normal, we will continue to follow it.  If it remains elevated, we can consider doing an ultrasound to further evaluate.  We will be in touch through MyChart with your results.  It was nice meeting you!

## 2024-06-22 ENCOUNTER — Encounter: Payer: Self-pay | Admitting: Podiatry

## 2024-06-22 ENCOUNTER — Ambulatory Visit: Payer: Self-pay | Admitting: Family Medicine

## 2024-06-22 ENCOUNTER — Other Ambulatory Visit: Payer: Self-pay | Admitting: Emergency Medicine

## 2024-06-22 LAB — HEPATIC FUNCTION PANEL
ALT: 32 [IU]/L (ref 0–44)
AST: 28 [IU]/L (ref 0–40)
Albumin: 5 g/dL — ABNORMAL HIGH (ref 3.8–4.9)
Alkaline Phosphatase: 85 [IU]/L (ref 47–123)
Bilirubin Total: 0.4 mg/dL (ref 0.0–1.2)
Bilirubin, Direct: 0.15 mg/dL (ref 0.00–0.40)
Total Protein: 7.1 g/dL (ref 6.0–8.5)

## 2025-05-28 ENCOUNTER — Ambulatory Visit

## 2025-05-28 ENCOUNTER — Encounter: Admitting: Family Medicine
